# Patient Record
Sex: Female | Born: 1940 | Race: White | Hispanic: No | Smoking: Never smoker
Health system: Southern US, Community
[De-identification: ages and names within clinical notes are randomized; demographics above are authoritative.]

## PROBLEM LIST (undated history)

## (undated) DIAGNOSIS — M1990 Unspecified osteoarthritis, unspecified site: Secondary | ICD-10-CM

## (undated) DIAGNOSIS — K5792 Diverticulitis of intestine, part unspecified, without perforation or abscess without bleeding: Secondary | ICD-10-CM

## (undated) DIAGNOSIS — J849 Interstitial pulmonary disease, unspecified: Secondary | ICD-10-CM

## (undated) DIAGNOSIS — J309 Allergic rhinitis, unspecified: Secondary | ICD-10-CM

## (undated) DIAGNOSIS — J45909 Unspecified asthma, uncomplicated: Secondary | ICD-10-CM

## (undated) DIAGNOSIS — I10 Essential (primary) hypertension: Secondary | ICD-10-CM

## (undated) DIAGNOSIS — E559 Vitamin D deficiency, unspecified: Secondary | ICD-10-CM

## (undated) DIAGNOSIS — H8109 Meniere's disease, unspecified ear: Secondary | ICD-10-CM

## (undated) DIAGNOSIS — I700 Atherosclerosis of aorta: Secondary | ICD-10-CM

## (undated) DIAGNOSIS — G609 Hereditary and idiopathic neuropathy, unspecified: Secondary | ICD-10-CM

## (undated) DIAGNOSIS — J302 Other seasonal allergic rhinitis: Secondary | ICD-10-CM

## (undated) DIAGNOSIS — G4700 Insomnia, unspecified: Secondary | ICD-10-CM

## (undated) DIAGNOSIS — E7800 Pure hypercholesterolemia, unspecified: Secondary | ICD-10-CM

## (undated) DIAGNOSIS — R51 Headache: Secondary | ICD-10-CM

## (undated) DIAGNOSIS — L309 Dermatitis, unspecified: Secondary | ICD-10-CM

## (undated) DIAGNOSIS — E039 Hypothyroidism, unspecified: Secondary | ICD-10-CM

## (undated) DIAGNOSIS — M8580 Other specified disorders of bone density and structure, unspecified site: Secondary | ICD-10-CM

## (undated) DIAGNOSIS — F419 Anxiety disorder, unspecified: Secondary | ICD-10-CM

## (undated) DIAGNOSIS — K219 Gastro-esophageal reflux disease without esophagitis: Secondary | ICD-10-CM

## (undated) HISTORY — DX: Diverticulitis of intestine, part unspecified, without perforation or abscess without bleeding: K57.92

## (undated) HISTORY — DX: Hypothyroidism, unspecified: E03.9

## (undated) HISTORY — DX: Hereditary and idiopathic neuropathy, unspecified: G60.9

## (undated) HISTORY — DX: Vitamin D deficiency, unspecified: E55.9

## (undated) HISTORY — DX: Anxiety disorder, unspecified: F41.9

## (undated) HISTORY — DX: Unspecified osteoarthritis, unspecified site: M19.90

## (undated) HISTORY — DX: Allergic rhinitis, unspecified: J30.9

## (undated) HISTORY — DX: Other seasonal allergic rhinitis: J30.2

## (undated) HISTORY — DX: Insomnia, unspecified: G47.00

## (undated) HISTORY — DX: Headache: R51

## (undated) HISTORY — DX: Interstitial pulmonary disease, unspecified: J84.9

## (undated) HISTORY — DX: Essential (primary) hypertension: I10

## (undated) HISTORY — DX: Atherosclerosis of aorta: I70.0

## (undated) HISTORY — DX: Meniere's disease, unspecified ear: H81.09

## (undated) HISTORY — DX: Pure hypercholesterolemia, unspecified: E78.00

## (undated) HISTORY — PX: CATARACT EXTRACTION: SUR2

## (undated) HISTORY — PX: CHOLECYSTECTOMY: SHX55

## (undated) HISTORY — DX: Dermatitis, unspecified: L30.9

## (undated) HISTORY — DX: Other specified disorders of bone density and structure, unspecified site: M85.80

---

## 1969-04-10 HISTORY — PX: ABDOMINAL HYSTERECTOMY: SHX81

## 1969-04-10 HISTORY — PX: PARTIAL HYSTERECTOMY: SHX80

## 1969-04-10 HISTORY — PX: APPENDECTOMY: SHX54

## 1987-04-11 HISTORY — PX: GALLBLADDER SURGERY: SHX652

## 1998-04-20 ENCOUNTER — Ambulatory Visit (HOSPITAL_COMMUNITY): Admission: RE | Admit: 1998-04-20 | Discharge: 1998-04-20 | Payer: Self-pay | Admitting: Family Medicine

## 1999-05-24 ENCOUNTER — Ambulatory Visit (HOSPITAL_COMMUNITY): Admission: RE | Admit: 1999-05-24 | Discharge: 1999-05-24 | Payer: Self-pay | Admitting: Family Medicine

## 1999-05-24 ENCOUNTER — Encounter: Payer: Self-pay | Admitting: Family Medicine

## 2005-03-20 ENCOUNTER — Emergency Department (HOSPITAL_COMMUNITY): Admission: EM | Admit: 2005-03-20 | Discharge: 2005-03-21 | Payer: Self-pay | Admitting: Emergency Medicine

## 2009-01-14 ENCOUNTER — Encounter: Admission: RE | Admit: 2009-01-14 | Discharge: 2009-01-14 | Payer: Self-pay | Admitting: Family Medicine

## 2010-07-18 ENCOUNTER — Encounter: Payer: Self-pay | Admitting: Gastroenterology

## 2011-02-17 ENCOUNTER — Institutional Professional Consult (permissible substitution): Payer: Self-pay | Admitting: Internal Medicine

## 2011-02-20 ENCOUNTER — Encounter: Payer: Self-pay | Admitting: Internal Medicine

## 2011-02-20 ENCOUNTER — Ambulatory Visit (INDEPENDENT_AMBULATORY_CARE_PROVIDER_SITE_OTHER)
Admission: RE | Admit: 2011-02-20 | Discharge: 2011-02-20 | Disposition: A | Discharge status: Home / Self Care | Payer: Medicare Other | Source: Ambulatory Visit | Attending: Internal Medicine | Admitting: Internal Medicine

## 2011-02-20 ENCOUNTER — Ambulatory Visit (INDEPENDENT_AMBULATORY_CARE_PROVIDER_SITE_OTHER): Payer: Medicare Other | Admitting: Internal Medicine

## 2011-02-20 VITALS — BP 164/84 | HR 80 | Ht 65.0 in | Wt 196.6 lb

## 2011-02-20 DIAGNOSIS — R05 Cough: Secondary | ICD-10-CM

## 2011-02-20 DIAGNOSIS — R059 Cough, unspecified: Secondary | ICD-10-CM

## 2011-02-20 MED ORDER — TRAMADOL HCL 50 MG PO TABS: 50.0000 mg | ORAL_TABLET | Freq: Three times a day (TID) | ORAL | Status: DC | PRN

## 2011-02-20 NOTE — Patient Instructions (Signed)
Script sent for tramadol tabs for  Cough  Order CXR- dx cough  Use your Qvar inhaler   Every day, 2 puffs,then rinse mouth, twice daily  Use your red Proair rescue inhaler up to 2 puffs, 4 times daily as needed  Take extra care against reflux- don't lie down for 2 hours after eating or drinking. Pay attention as you swallow- see how important eating and drinking are to triggering this cough
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
## 2011-02-23 DIAGNOSIS — J42 Unspecified chronic bronchitis: Secondary | ICD-10-CM | POA: Insufficient documentation

## 2011-03-23 ENCOUNTER — Encounter: Payer: Self-pay | Admitting: Internal Medicine

## 2011-03-23 ENCOUNTER — Ambulatory Visit (INDEPENDENT_AMBULATORY_CARE_PROVIDER_SITE_OTHER): Payer: Medicare Other | Admitting: Internal Medicine

## 2011-03-23 VITALS — BP 118/78 | HR 85 | Ht 65.0 in | Wt 198.6 lb

## 2011-03-23 DIAGNOSIS — R05 Cough: Secondary | ICD-10-CM

## 2011-03-23 DIAGNOSIS — Z23 Encounter for immunization: Secondary | ICD-10-CM

## 2011-03-23 MED ORDER — BENZONATATE 200 MG PO CAPS: 200.0000 mg | ORAL_CAPSULE | Freq: Three times a day (TID) | ORAL | Status: DC | PRN
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
## 2011-03-23 NOTE — Patient Instructions (Signed)
Stop tramadol. Try replacing it with benzonatate pearls for cough as needed- script sent  Continue Qvar for another month. You can decide then to stop and see how you do, and restart if needed  Flu vaccine  Please call as needed

## 2011-03-26 NOTE — Assessment & Plan Note (Signed)
Cough centers suppression seems to have worked well for her and may have broken a feedback loop. We will stop tramadol now and try benzonatate on an as-needed basis while she continues Qvar 40. Flu vaccine given.

## 2011-06-16 ENCOUNTER — Other Ambulatory Visit: Payer: Self-pay | Admitting: Family Medicine

## 2011-06-16 ENCOUNTER — Ambulatory Visit
Admission: RE | Admit: 2011-06-16 | Discharge: 2011-06-16 | Disposition: A | Discharge status: Home / Self Care | Payer: Medicare Other | Source: Ambulatory Visit | Attending: Family Medicine | Admitting: Family Medicine

## 2011-06-16 DIAGNOSIS — R1032 Left lower quadrant pain: Secondary | ICD-10-CM

## 2011-06-16 MED ORDER — IOHEXOL 300 MG/ML  SOLN
30.0000 mL | INTRAMUSCULAR | Status: AC
  Administered 2011-06-16: 30 mL via ORAL

## 2011-06-16 MED ORDER — IOHEXOL 300 MG/ML  SOLN
125.0000 mL | Freq: Once | INTRAMUSCULAR | Status: AC | PRN
  Administered 2011-06-16: 125 mL via INTRAVENOUS

## 2011-07-21 ENCOUNTER — Other Ambulatory Visit: Payer: Self-pay | Admitting: Family Medicine

## 2011-07-21 ENCOUNTER — Ambulatory Visit
Admission: RE | Admit: 2011-07-21 | Discharge: 2011-07-21 | Disposition: A | Discharge status: Home / Self Care | Payer: Medicare Other | Source: Ambulatory Visit | Attending: Family Medicine | Admitting: Family Medicine

## 2011-07-21 DIAGNOSIS — R51 Headache: Secondary | ICD-10-CM

## 2011-07-21 DIAGNOSIS — M542 Cervicalgia: Secondary | ICD-10-CM

## 2011-08-28 ENCOUNTER — Other Ambulatory Visit: Payer: Self-pay | Admitting: Family Medicine

## 2011-08-28 ENCOUNTER — Ambulatory Visit
Admission: RE | Admit: 2011-08-28 | Discharge: 2011-08-28 | Disposition: A | Discharge status: Home / Self Care | Payer: Medicare Other | Source: Ambulatory Visit | Attending: Family Medicine | Admitting: Family Medicine

## 2011-08-28 DIAGNOSIS — J984 Other disorders of lung: Secondary | ICD-10-CM

## 2011-09-05 ENCOUNTER — Telehealth: Payer: Self-pay | Admitting: Gastroenterology

## 2011-09-05 NOTE — Telephone Encounter (Signed)
Pt states that she has been having problems with diverticulitis. States she took Cipro and Flagyl for 10day. When she stopped them she started having problems again. States she took 14 more days of the antibiotics followed by 14 days of a probiotic. She reports she is still having some problems and her PCP recommended she call here. Pt scheduled to see Dr. Arlyce Dice 09/08/11@9 :15am. Pt aware of appt date and time.

## 2011-09-08 ENCOUNTER — Ambulatory Visit (INDEPENDENT_AMBULATORY_CARE_PROVIDER_SITE_OTHER): Payer: Medicare Other | Admitting: Gastroenterology

## 2011-09-08 ENCOUNTER — Other Ambulatory Visit (INDEPENDENT_AMBULATORY_CARE_PROVIDER_SITE_OTHER): Payer: Medicare Other

## 2011-09-08 ENCOUNTER — Encounter: Payer: Self-pay | Admitting: Gastroenterology

## 2011-09-08 VITALS — BP 124/64 | HR 76 | Ht 65.0 in | Wt 198.0 lb

## 2011-09-08 DIAGNOSIS — R05 Cough: Secondary | ICD-10-CM

## 2011-09-08 DIAGNOSIS — K5732 Diverticulitis of large intestine without perforation or abscess without bleeding: Secondary | ICD-10-CM

## 2011-09-08 DIAGNOSIS — R1032 Left lower quadrant pain: Secondary | ICD-10-CM

## 2011-09-08 DIAGNOSIS — K5792 Diverticulitis of intestine, part unspecified, without perforation or abscess without bleeding: Secondary | ICD-10-CM | POA: Insufficient documentation

## 2011-09-08 LAB — BASIC METABOLIC PANEL
Chloride: 101 mEq/L (ref 96–112)
GFR: 75.05 mL/min (ref >60.00)
Potassium: 3.9 mEq/L (ref 3.5–5.1)
Sodium: 138 mEq/L (ref 135–145)

## 2011-09-08 NOTE — Patient Instructions (Signed)
  You have been scheduled for a CT scan of the abdomen and pelvis at [facility] ([address]---this is in the same building as [facility]).   You are scheduled on 09/12/2011 at 9am. You should arrive 15 minutes prior to your appointment time for registration. Please follow the written instructions below on the day of your exam:  WARNING: IF YOU ARE ALLERGIC TO IODINE/X-RAY DYE, PLEASE NOTIFY RADIOLOGY IMMEDIATELY AT [phone]! YOU WILL BE GIVEN A 13 HOUR PREMEDICATION PREP.  1) Do not eat or drink anything after 5am (4 hours prior to your test) 2) You have been given 2 bottles of oral contrast to drink. The solution may taste               better if refrigerated, but do NOT add ice or any other liquid to this solution. Shake             well before drinking.    Drink 1 bottle of contrast @ 7am (2 hours prior to your exam)  Drink 1 bottle of contrast @ 8am (1 hour prior to your exam)  You may take any medications as prescribed with a small amount of water except for the following: Metformin, Glucophage, Glucovance, Avandamet, Riomet, Fortamet, Actoplus Met, Janumet, Glumetza or Metaglip. The above medications must be held the day of the exam AND 48 hours after the exam.  The purpose of you drinking the oral contrast is to aid in the visualization of your intestinal tract. The contrast solution may cause some diarrhea. Before your exam is started, you will be given a small amount of fluid to drink. Depending on your individual set of symptoms, you may also receive an intravenous injection of x-ray contrast/dye. Plan on being at [facility] for 30 minutes or long, depending on the type of exam you are having performed.  If you have any questions regarding your exam or if you need to reschedule, you may call the CT department at [phone] between the hours of 8:00 am and 5:00 pm,  Monday-Friday.  ________________________________________________________________________

## 2011-09-08 NOTE — Assessment & Plan Note (Addendum)
Abdominal pain could be due to residual inflammation related to prior diverticulitis. A low-grade infection is also a possibility. Abdominal abscess is much less likely. Patient has also noted a change in bowel habits which probably is related to antibiotics. Pseudomembranous colitis should be ruled out.  Recommendations #1 followup CT of the abdomen and pelvis #2 check stools for C. difficile toxin #3 colonoscopy if CT scan is unrevealing

## 2011-09-08 NOTE — Progress Notes (Signed)
History of Present Illness: Yolanda Hill is a 70-year-old white female referred at the request of Dr. Harris for evaluation of abdominal pain.  In March, 2013 she developed lower abdominal pain. CT scan, which I reviewed, demonstrated some inflammatory changes in the area of the sigmoid colon consistent with acute diverticulitis. She was treated with antibiotics with only partial  resolution of her pain. This was  followed by a second course of antibiotics approximately 3-4 weeks ago. She still complains of mild, residual lower abdominal pain. She has urgency at the stool but denies diarrhea, per se. There is no history of bleeding or fevers.  Patient also complains of persistent nonproductive cough. She was evaluated by pulmonary and told to possibly have reflux as a cause for her cough. Despite twice a day omeprazole cough continues. Chest x-ray apparently was negative. She denies pyrosis, sore throat or dysphagia.    Past Medical History  Diagnosis Date  . High blood pressure   . High cholesterol   . Seasonal allergies   . Diverticulitis   . Insomnia    Past Surgical History  Procedure Date  . Gallbladder surgery 1989  . Partial hysterectomy 1971   family history includes Emphysema in her paternal grandfather and Heart disease in an unspecified family member. Current Outpatient Prescriptions  Medication Sig Dispense Refill  . albuterol (PROAIR HFA) 108 (90 BASE) MCG/ACT inhaler Inhale 2 puffs into the lungs 4 (four) times daily as needed.        . aspirin 325 MG tablet Take 325 mg by mouth daily.        . azelastine (ASTELIN) 137 MCG/SPRAY nasal spray Place 1 spray into the nose 2 (two) times daily. Use in each nostril as directed       . beclomethasone (QVAR) 40 MCG/ACT inhaler Inhale 2 puffs into the lungs 2 (two) times daily.        . benzonatate (TESSALON) 200 MG capsule Take 1 capsule (200 mg total) by mouth 3 (three) times daily as needed for cough.  30 capsule  4  . citalopram  (CELEXA) 40 MG tablet Take 1 tablet by mouth daily.      . LORazepam (ATIVAN) 0.5 MG tablet Take 0.5 mg by mouth 2 (two) times daily.        . losartan-hydrochlorothiazide (HYZAAR) 50-12.5 MG per tablet Take 1 tablet by mouth daily.      . omeprazole (PRILOSEC) 20 MG capsule Take 1 tablet by mouth Twice daily.      . traZODone (DESYREL) 100 MG tablet Take 100 mg by mouth every other day.      . zolpidem (AMBIEN) 10 MG tablet Take 1 tablet by mouth At bedtime as needed.       Allergies as of 09/08/2011 - Review Complete 09/08/2011  Allergen Reaction Noted  . Codeine Nausea Only 02/20/2011    reports that she has never smoked. She does not have any smokeless tobacco history on file. She reports that she does not drink alcohol or use illicit drugs.     Review of Systems: She complains of posterior neck discomfort Pertinent positive and negative review of systems were noted in the above HPI section. All other review of systems were otherwise negative.  Vital signs were reviewed in today's medical record Physical Exam: General: Well developed , well nourished, no acute distress Head: Normocephalic and atraumatic Eyes:  sclerae anicteric, EOMI Ears: Normal auditory acuity Mouth: No deformity or lesions Neck: Supple, no masses or thyromegaly Lungs:   Clear throughout to auscultation Heart: Regular rate and rhythm; no murmurs, rubs or bruits Abdomen: Soft,  and non distended. No masses, hepatosplenomegaly or hernias noted. Normal Bowel sounds. There is very mild tenderness in the left lower quadrant just inferior to the umbilicus without guarding or rebound Rectal:deferred Musculoskeletal: Symmetrical with no gross deformities  Skin: No lesions on visible extremities Pulses:  Normal pulses noted Extremities: No clubbing, cyanosis, edema or deformities noted Neurological: Alert oriented x 4, grossly nonfocal Cervical Nodes:  No significant cervical adenopathy Inguinal Nodes: No significant  inguinal adenopathy Psychological:  Alert and cooperative. Normal mood and affect       

## 2011-09-08 NOTE — Assessment & Plan Note (Signed)
Symptoms have not improved with occasions, thus far including omeprazole. The question of cough due to reflux has been raised.  Recommendations #1 upper endoscopy with 48 hour bravo pH study while on omeprazole to rule out significant acid reflux

## 2011-09-11 ENCOUNTER — Other Ambulatory Visit: Payer: Medicare Other

## 2011-09-11 DIAGNOSIS — K5792 Diverticulitis of intestine, part unspecified, without perforation or abscess without bleeding: Secondary | ICD-10-CM

## 2011-09-11 DIAGNOSIS — R1032 Left lower quadrant pain: Secondary | ICD-10-CM

## 2011-09-12 ENCOUNTER — Ambulatory Visit (INDEPENDENT_AMBULATORY_CARE_PROVIDER_SITE_OTHER)
Admission: RE | Admit: 2011-09-12 | Discharge: 2011-09-12 | Disposition: A | Discharge status: Home / Self Care | Payer: Medicare Other | Source: Ambulatory Visit | Attending: Gastroenterology | Admitting: Gastroenterology

## 2011-09-12 DIAGNOSIS — K5792 Diverticulitis of intestine, part unspecified, without perforation or abscess without bleeding: Secondary | ICD-10-CM

## 2011-09-12 DIAGNOSIS — K5732 Diverticulitis of large intestine without perforation or abscess without bleeding: Secondary | ICD-10-CM

## 2011-09-12 LAB — CLOSTRIDIUM DIFFICILE EIA: CDIFTX: NEGATIVE

## 2011-09-12 MED ORDER — IOHEXOL 300 MG/ML  SOLN
100.0000 mL | Freq: Once | INTRAMUSCULAR | Status: AC | PRN
  Administered 2011-09-12: 100 mL via INTRAVENOUS

## 2011-09-13 ENCOUNTER — Encounter: Payer: Self-pay | Admitting: Gastroenterology

## 2011-09-14 ENCOUNTER — Telehealth: Payer: Self-pay

## 2011-09-14 NOTE — Telephone Encounter (Signed)
Spoke with pt and let her know her CT results. Pt wants to know which procedure she should have 1st. She is already scheduled for EGD with bravo 09/29/11. Dr. Arlyce Dice states she could have colon in 2-3 weeks. Pt wants to know which is more important and should be done 1st. Please advise.

## 2011-09-18 NOTE — Telephone Encounter (Signed)
EGD first

## 2011-09-18 NOTE — Telephone Encounter (Signed)
Pt aware.

## 2011-09-18 NOTE — Telephone Encounter (Signed)
Left message for pt to call back  °

## 2011-09-29 ENCOUNTER — Encounter (HOSPITAL_COMMUNITY)
Admission: RE | Disposition: A | Discharge status: Home / Self Care | Payer: Self-pay | Source: Ambulatory Visit | Attending: Gastroenterology

## 2011-09-29 ENCOUNTER — Encounter (HOSPITAL_BASED_OUTPATIENT_CLINIC_OR_DEPARTMENT_OTHER): Payer: Medicare Other | Admitting: Gastroenterology

## 2011-09-29 ENCOUNTER — Encounter (HOSPITAL_COMMUNITY): Payer: Self-pay | Admitting: *Deleted

## 2011-09-29 ENCOUNTER — Ambulatory Visit (HOSPITAL_COMMUNITY)
Admission: RE | Admit: 2011-09-29 | Discharge: 2011-09-29 | Disposition: A | Discharge status: Home / Self Care | Payer: Medicare Other | Source: Ambulatory Visit | Attending: Gastroenterology | Admitting: Gastroenterology

## 2011-09-29 DIAGNOSIS — R059 Cough, unspecified: Secondary | ICD-10-CM | POA: Insufficient documentation

## 2011-09-29 DIAGNOSIS — K219 Gastro-esophageal reflux disease without esophagitis: Secondary | ICD-10-CM | POA: Insufficient documentation

## 2011-09-29 DIAGNOSIS — I10 Essential (primary) hypertension: Secondary | ICD-10-CM | POA: Insufficient documentation

## 2011-09-29 DIAGNOSIS — E7800 Pure hypercholesterolemia, unspecified: Secondary | ICD-10-CM | POA: Insufficient documentation

## 2011-09-29 DIAGNOSIS — R05 Cough: Secondary | ICD-10-CM

## 2011-09-29 DIAGNOSIS — Z79899 Other long term (current) drug therapy: Secondary | ICD-10-CM | POA: Insufficient documentation

## 2011-09-29 DIAGNOSIS — K449 Diaphragmatic hernia without obstruction or gangrene: Secondary | ICD-10-CM | POA: Insufficient documentation

## 2011-09-29 HISTORY — DX: Unspecified asthma, uncomplicated: J45.909

## 2011-09-29 HISTORY — PX: BRAVO PH STUDY: SHX5421

## 2011-09-29 HISTORY — DX: Unspecified osteoarthritis, unspecified site: M19.90

## 2011-09-29 HISTORY — DX: Headache: R51

## 2011-09-29 HISTORY — PX: ESOPHAGOGASTRODUODENOSCOPY: SHX5428

## 2011-09-29 HISTORY — DX: Gastro-esophageal reflux disease without esophagitis: K21.9

## 2011-09-29 SURGERY — EGD (ESOPHAGOGASTRODUODENOSCOPY)
Anesthesia: Moderate Sedation

## 2011-09-29 MED ORDER — MIDAZOLAM HCL 10 MG/2ML IJ SOLN
INTRAMUSCULAR | Status: DC | PRN
  Administered 2011-09-29 (×3): 2 mg via INTRAVENOUS

## 2011-09-29 MED ORDER — SODIUM CHLORIDE 0.9 % IV SOLN: Freq: Once | INTRAVENOUS | Status: DC

## 2011-09-29 MED ORDER — FENTANYL NICU IV SYRINGE 50 MCG/ML
INJECTION | INTRAMUSCULAR | Status: DC | PRN
  Administered 2011-09-29 (×3): 25 ug via INTRAVENOUS

## 2011-09-29 MED ORDER — FENTANYL CITRATE 0.05 MG/ML IJ SOLN
INTRAMUSCULAR | Status: AC
  Filled 2011-09-29: qty 2

## 2011-09-29 MED ORDER — DIPHENHYDRAMINE HCL 50 MG/ML IJ SOLN
INTRAMUSCULAR | Status: AC
  Filled 2011-09-29: qty 1

## 2011-09-29 MED ORDER — MIDAZOLAM HCL 10 MG/2ML IJ SOLN
INTRAMUSCULAR | Status: AC
  Filled 2011-09-29: qty 2

## 2011-09-29 MED ORDER — BUTAMBEN-TETRACAINE-BENZOCAINE 2-2-14 % EX AERO
INHALATION_SPRAY | CUTANEOUS | Status: DC | PRN
  Administered 2011-09-29: 2 via TOPICAL

## 2011-09-29 MED ORDER — GLYCOPYRROLATE 0.2 MG/ML IJ SOLN
INTRAMUSCULAR | Status: AC
  Filled 2011-09-29: qty 1

## 2011-09-29 MED ORDER — GLYCOPYRROLATE 0.2 MG/ML IJ SOLN
INTRAMUSCULAR | Status: DC | PRN
  Administered 2011-09-29: 0.2 mg via INTRAVENOUS

## 2011-09-29 MED ORDER — DIPHENHYDRAMINE HCL 50 MG/ML IJ SOLN
INTRAMUSCULAR | Status: DC | PRN
  Administered 2011-09-29: 25 mg via INTRAVENOUS

## 2011-09-29 NOTE — Discharge Instructions (Addendum)
Endoscopy Care After Please read the instructions outlined below and refer to this sheet in the next few weeks. These discharge instructions provide you with general information on caring for yourself after you leave the hospital. Your doctor may also give you specific instructions. While your treatment has been planned according to the most current medical practices available, unavoidable complications occasionally occur. If you have any problems or questions after discharge, please call your doctor. HOME CARE INSTRUCTIONS Activity  You may resume your regular activity but move at a slower pace for the next 24 hours.   Take frequent rest periods for the next 24 hours.   Walking will help expel (get rid of) the air and reduce the bloated feeling in your abdomen.   No driving for 24 hours (because of the anesthesia (medicine) used during the test).   You may shower.   Do not sign any important legal documents or operate any machinery for 24 hours (because of the anesthesia used during the test).  Nutrition  Drink plenty of fluids.   You may resume your normal diet.   Begin with a light meal and progress to your normal diet.   Avoid alcoholic beverages for 24 hours or as instructed by your caregiver.  Medications You may resume your normal medications unless your caregiver tells you otherwise. What you can expect today  You may experience abdominal discomfort such as a feeling of fullness or "gas" pains.   You may experience a sore throat for 2 to 3 days. This is normal. Gargling with salt water may help this.  Follow-up Your doctor will discuss the results of your test with you. SEEK IMMEDIATE MEDICAL CARE IF:  You have excessive nausea (feeling sick to your stomach) and/or vomiting.   You have severe abdominal pain and distention (swelling).   You have trouble swallowing.   You have a temperature over 100 F (37.8 C).   You have rectal bleeding or vomiting of blood.    Document Released: 11/09/2003 Document Revised: 03/16/2011 Document Reviewed: 05/22/2007 Exit[employer] Patient Information 2012 [employer]. 

## 2011-09-29 NOTE — Op Note (Signed)
[facility] [address] [place], [place]  [address]  ENDOSCOPY PROCEDURE REPORT  PATIENT:  Yolanda, Hill  MR#:  [id number] BIRTHDATE:  [birth year], 70 yrs. old  GENDER:  female  ENDOSCOPIST:  Barbette Hair. Arlyce Dice, MD Referred by:  Holley Bouche, M.D.  PROCEDURE DATE:  09/29/2011 PROCEDURE:  Bravo pH probe placement ASA CLASS:  Class II INDICATIONS:  cough  MEDICATIONS:   These medications were titrated to patient response per physician's verbal order, Fentanyl 75 mcg IV, Versed 6 mg IV, glycopyrrolate (Robinal) 0.2 mg IV TOPICAL ANESTHETIC:  Cetacaine Spray  DESCRIPTION OF PROCEDURE:   After the risks and benefits of the procedure were explained, informed consent was obtained.  The Pentax Gastroscope [id number] endoscope was introduced through the mouth and advanced to the third portion of the duodenum.  The instrument was slowly withdrawn as the mucosa was fully examined. <<PROCEDUREIMAGES>>  A hiatal hernia was found. 2 cm sliding hiatal. Z line at 42 cm. A Bravo pH monitor was attached to the esophageal mucosa. bravo pH probe was inserted at 36 cm from the incisors (see image1, image2, and image4).    Retroflexed views revealed no abnormalities. The scope was then withdrawn from the patient and the procedure completed.  COMPLICATIONS:  None  ENDOSCOPIC IMPRESSION: 1) GERD #2 chronic cough RECOMMENDATIONS: 1) continue PPI 2) Call office next 2-3 days to schedule an office appointment for 2 weeks  ______________________________ Barbette Hair. Arlyce Dice, MD  CC:  n. eSIGNED:   Barbette Hair. Kyndle Schlender at 09/29/2011 12:51 PM  Vella Redhead, [phone]

## 2011-09-29 NOTE — H&P (View-Only) (Signed)
History of Present Illness: Yolanda Hill is a 71 year old white female referred at the request of Dr. Tiburcio Pea for evaluation of abdominal pain.  In March, 2013 she developed lower abdominal pain. CT scan, which I reviewed, demonstrated some inflammatory changes in the area of the sigmoid colon consistent with acute diverticulitis. She was treated with antibiotics with only partial  resolution of her pain. This was  followed by a second course of antibiotics approximately 3-4 weeks ago. She still complains of mild, residual lower abdominal pain. She has urgency at the stool but denies diarrhea, per se. There is no history of bleeding or fevers.  Patient also complains of persistent nonproductive cough. She was evaluated by pulmonary and told to possibly have reflux as a cause for her cough. Despite twice a day omeprazole cough continues. Chest x-ray apparently was negative. She denies pyrosis, sore throat or dysphagia.    Past Medical History  Diagnosis Date  . High blood pressure   . High cholesterol   . Seasonal allergies   . Diverticulitis   . Insomnia    Past Surgical History  Procedure Date  . Gallbladder surgery 1989  . Partial hysterectomy 1971   family history includes Emphysema in her paternal grandfather and Heart disease in an unspecified family member. Current Outpatient Prescriptions  Medication Sig Dispense Refill  . albuterol (PROAIR HFA) 108 (90 BASE) MCG/ACT inhaler Inhale 2 puffs into the lungs 4 (four) times daily as needed.        Marland Kitchen aspirin 325 MG tablet Take 325 mg by mouth daily.        Marland Kitchen azelastine (ASTELIN) 137 MCG/SPRAY nasal spray Place 1 spray into the nose 2 (two) times daily. Use in each nostril as directed       . beclomethasone (QVAR) 40 MCG/ACT inhaler Inhale 2 puffs into the lungs 2 (two) times daily.        . benzonatate (TESSALON) 200 MG capsule Take 1 capsule (200 mg total) by mouth 3 (three) times daily as needed for cough.  30 capsule  4  . citalopram  (CELEXA) 40 MG tablet Take 1 tablet by mouth daily.      Marland Kitchen LORazepam (ATIVAN) 0.5 MG tablet Take 0.5 mg by mouth 2 (two) times daily.        Marland Kitchen losartan-hydrochlorothiazide (HYZAAR) 50-12.5 MG per tablet Take 1 tablet by mouth daily.      Marland Kitchen omeprazole (PRILOSEC) 20 MG capsule Take 1 tablet by mouth Twice daily.      . traZODone (DESYREL) 100 MG tablet Take 100 mg by mouth every other day.      . zolpidem (AMBIEN) 10 MG tablet Take 1 tablet by mouth At bedtime as needed.       Allergies as of 09/08/2011 - Review Complete 09/08/2011  Allergen Reaction Noted  . Codeine Nausea Only 02/20/2011    reports that she has never smoked. She does not have any smokeless tobacco history on file. She reports that she does not drink alcohol or use illicit drugs.     Review of Systems: She complains of posterior neck discomfort Pertinent positive and negative review of systems were noted in the above HPI section. All other review of systems were otherwise negative.  Vital signs were reviewed in today's medical record Physical Exam: General: Well developed , well nourished, no acute distress Head: Normocephalic and atraumatic Eyes:  sclerae anicteric, EOMI Ears: Normal auditory acuity Mouth: No deformity or lesions Neck: Supple, no masses or thyromegaly Lungs:  Clear throughout to auscultation Heart: Regular rate and rhythm; no murmurs, rubs or bruits Abdomen: Soft,  and non distended. No masses, hepatosplenomegaly or hernias noted. Normal Bowel sounds. There is very mild tenderness in the left lower quadrant just inferior to the umbilicus without guarding or rebound Rectal:deferred Musculoskeletal: Symmetrical with no gross deformities  Skin: No lesions on visible extremities Pulses:  Normal pulses noted Extremities: No clubbing, cyanosis, edema or deformities noted Neurological: Alert oriented x 4, grossly nonfocal Cervical Nodes:  No significant cervical adenopathy Inguinal Nodes: No significant  inguinal adenopathy Psychological:  Alert and cooperative. Normal mood and affect

## 2011-09-29 NOTE — Interval H&P Note (Signed)
History and Physical Interval Note:  09/29/2011 12:31 PM  Yolanda Hill  has presented today for surgery, with the diagnosis of abdominal pain  The various methods of treatment have been discussed with the patient and family. After consideration of risks, benefits and other options for treatment, the patient has consented to  Procedure(s) (LRB): ESOPHAGOGASTRODUODENOSCOPY (EGD) (N/A) BRAVO PH STUDY (N/A) as a surgical intervention .  The patient's history has been reviewed, patient examined, no change in status, stable for surgery.  I have reviewed the patients' chart and labs.  Questions were answered to the patient's satisfaction.     The recent H&P (dated *09/08/11**) was reviewed, the patient was examined and there is no change in the patients condition since that H&P was completed.   [name]  09/29/2011, 12:31 PM   [name]

## 2011-10-02 ENCOUNTER — Encounter (HOSPITAL_COMMUNITY): Payer: Self-pay | Admitting: Gastroenterology

## 2011-10-03 ENCOUNTER — Telehealth: Payer: Self-pay | Admitting: Gastroenterology

## 2011-10-03 NOTE — Telephone Encounter (Signed)
Pt scheduled for OV 10/23/11@10 :30am. Pt aware of appt date and time.

## 2011-10-23 ENCOUNTER — Ambulatory Visit (INDEPENDENT_AMBULATORY_CARE_PROVIDER_SITE_OTHER): Payer: Medicare Other | Admitting: Gastroenterology

## 2011-10-23 ENCOUNTER — Encounter: Payer: Self-pay | Admitting: Gastroenterology

## 2011-10-23 ENCOUNTER — Telehealth: Payer: Self-pay | Admitting: Internal Medicine

## 2011-10-23 VITALS — BP 122/80 | HR 80 | Ht 65.0 in | Wt 196.2 lb

## 2011-10-23 DIAGNOSIS — R1032 Left lower quadrant pain: Secondary | ICD-10-CM

## 2011-10-23 DIAGNOSIS — R05 Cough: Secondary | ICD-10-CM

## 2011-10-23 DIAGNOSIS — R059 Cough, unspecified: Secondary | ICD-10-CM

## 2011-10-23 NOTE — Progress Notes (Signed)
History of Present Illness:  The patient returns for followup of abdominal pain and cough. The former has entirely subsided. CT scan demonstrated mild residual inflammation but no abscess . Since her last visit her pain slowly declined and is now absent.  Cough continues. 48 hour bravo pH study did not demonstrate significant acid reflux. Coughing episodes were independent of her brief episodes of reflux.    Review of Systems: Pertinent positive and negative review of systems were noted in the above HPI section. All other review of systems were otherwise negative.    Current Medications, Allergies, Past Medical History, Past Surgical History, Family History and Social History were reviewed in [employer] electronic medical record  Vital signs were reviewed in today's medical record. Physical Exam: General: Well developed , well nourished, no acute distress

## 2011-10-23 NOTE — Assessment & Plan Note (Signed)
Clinically resolved and probably represented mild inflammatory changes secondary to diverticulitis

## 2011-10-23 NOTE — Patient Instructions (Addendum)
Follow up as needed You will need to schedule a follow up appointment with Dr Maple Hudson

## 2011-10-23 NOTE — Telephone Encounter (Signed)
LMOMTCB x 1. Pt can see TP or one of the other providers this week for cough. CDY out of the office.

## 2011-10-23 NOTE — Telephone Encounter (Signed)
Spoke with pt and scheduled ov with TP for 10-26-11. I offered one with MW for tomorrow and she states can not come in until Thurs. Nothing further needed.

## 2011-10-23 NOTE — Assessment & Plan Note (Signed)
48 hour pH study clearly demonstrated absence of significant esophageal reflux in the setting of persistent cough.  Recommendations #1 per pulmonary

## 2011-10-23 NOTE — Progress Notes (Signed)
Patient ID: Yolanda Hill, female   DOB: [birth year], 71 y.o.   MRN: [id number] 48 hour bravo pH study was performed.  Patient remained on omeprazole.  Findings #1 in the first 24 hours there were 71 reflux episodes. Fraction time below pH 4 was 10.3%. There was no correlation between coughing episodes and reflux. On day 2 there were no episodes of reflux. There are multiple episodes of coughing.  Impression significant gastroesophageal reflux for a 2 hour. On day one that did not correlate with coughing. No significant reflux was seen on day 2. Coughing episodes were entirely independent of reflux.  Recommendations #1 continue omeprazole #2 pulmonary followup for cough

## 2011-10-26 ENCOUNTER — Encounter: Payer: Self-pay | Admitting: Adult Health

## 2011-10-26 ENCOUNTER — Ambulatory Visit (INDEPENDENT_AMBULATORY_CARE_PROVIDER_SITE_OTHER): Payer: Medicare Other | Admitting: Adult Health

## 2011-10-26 VITALS — BP 118/80 | HR 78 | Temp 97.3°F | Ht 65.0 in | Wt 197.6 lb

## 2011-10-26 DIAGNOSIS — R05 Cough: Secondary | ICD-10-CM

## 2011-10-26 MED ORDER — BENZONATATE 200 MG PO CAPS: 200.0000 mg | ORAL_CAPSULE | Freq: Three times a day (TID) | ORAL | Status: DC | PRN

## 2011-10-26 MED ORDER — PREDNISONE 10 MG PO TABS: ORAL_TABLET | ORAL | Status: DC

## 2011-10-26 MED ORDER — TRAMADOL HCL 50 MG PO TABS: 50.0000 mg | ORAL_TABLET | Freq: Three times a day (TID) | ORAL | Status: DC | PRN

## 2011-10-26 MED ORDER — TRAMADOL HCL 50 MG PO TABS: 50.0000 mg | ORAL_TABLET | ORAL | Status: DC | PRN

## 2011-10-26 NOTE — Patient Instructions (Addendum)
Add  Pepcid 20 mg at bedtime. Delsym 2 teaspoons twice daily  Tessalon 200 mg 3 times daily. Tramadol 50 mg, one every 4 hours as needed. For cough control. Goal is no coughing or throat clearing. Use sugarless candy sips of water, to help avoid coughing or throat clearing. Do not use Mint products  Prednisone taper. Over the next week. Chlor-Trimeton 4 mg 2 tablets at bedtime.  I will call with CT results Follow with Dr. Maple Hudson in 4 weeks and as needed Please contact office for sooner follow up if symptoms do not improve or worsen or seek emergency care  Hold fish oil

## 2011-10-26 NOTE — Assessment & Plan Note (Signed)
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 Delsym 2 teaspoons twice daily  Tessalon 200 mg 3 times daily. Tramadol 50 mg, one every 4 hours as needed. For cough control. Goal is no coughing or throat clearing. Use sugarless candy sips of water, to help avoid coughing or throat clearing. Do not use Mint products  Prednisone taper. Over the next week. Chlor-Trimeton 4 mg 2 tablets at bedtime.  I will call with CT results Follow with Dr. Maple Hudson in 4 weeks and as needed Please contact office for sooner follow up if symptoms do not improve or worsen or seek emergency care  Hold fish oil
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
## 2011-10-30 ENCOUNTER — Encounter: Payer: Self-pay | Admitting: Gastroenterology

## 2011-10-31 ENCOUNTER — Ambulatory Visit (INDEPENDENT_AMBULATORY_CARE_PROVIDER_SITE_OTHER)
Admission: RE | Admit: 2011-10-31 | Discharge: 2011-10-31 | Disposition: A | Discharge status: Home / Self Care | Payer: Medicare Other | Source: Ambulatory Visit | Attending: Adult Health | Admitting: Adult Health

## 2011-10-31 DIAGNOSIS — R05 Cough: Secondary | ICD-10-CM

## 2011-10-31 DIAGNOSIS — R059 Cough, unspecified: Secondary | ICD-10-CM

## 2011-11-02 ENCOUNTER — Telehealth: Payer: Self-pay | Admitting: Internal Medicine

## 2011-11-02 NOTE — Telephone Encounter (Signed)
Spoke with patient in regards to message left for CDY. Per CY pt informed of CT results; TP out of office. CT Sinus Neg (-) for Sinusitis. Pt stated she will continue her current regimen per TP at last OV and will call if symptoms persist.   Yolanda Hill, CMA

## 2011-11-02 NOTE — Telephone Encounter (Signed)
Pt saw TP on 10-26-11 and a CT sinus was ordered. TP is out of office andpt is requesting results so I will send request to Dr. Maple Hudson. Please advise on CT sinus results. Thanks. Carron Curie, CMA

## 2011-11-06 ENCOUNTER — Encounter: Payer: Self-pay | Admitting: Adult Health

## 2011-12-13 ENCOUNTER — Other Ambulatory Visit: Payer: Self-pay | Admitting: Adult Health

## 2012-01-09 ENCOUNTER — Other Ambulatory Visit: Payer: Self-pay | Admitting: Adult Health

## 2012-01-09 ENCOUNTER — Ambulatory Visit: Payer: Medicare Other | Admitting: Gastroenterology

## 2012-01-10 NOTE — Telephone Encounter (Signed)
Received electronic refill request for tessalon 200mg  tid prn #45 and tramadol 50mg  q4h prn #45.  Pt last seen 7.18.13 by TP for sick visit and given tessalon and tramadol for cough.  Pt recommended to follow up in 4 weeks w/ CY but no appt was ever made and pt has none pending.    Dr Maple Hudson, may pt have refills w/ the understanding that she must come in for appt?  Thanks.

## 2012-01-10 NOTE — Telephone Encounter (Signed)
Ok to refill, but make a routine f/u appointment.

## 2012-01-11 NOTE — Telephone Encounter (Signed)
LMOMTCB x 1 

## 2012-01-11 NOTE — Telephone Encounter (Signed)
ATC pt x2 - line busy. 

## 2012-01-12 NOTE — Telephone Encounter (Signed)
ATC x2, line busy 

## 2012-01-12 NOTE — Telephone Encounter (Signed)
Returning call can be reached at [phone].Yolanda Hill

## 2012-01-16 NOTE — Telephone Encounter (Signed)
Ok to refill benzonatate and tramadol

## 2012-01-17 NOTE — Telephone Encounter (Signed)
LMOM TCB x1 - left detailed message informing pt that we will be happy to refill these meds for her but she needs to schedule a follow up appt w/ CY before we can do so.

## 2012-01-19 ENCOUNTER — Telehealth: Payer: Self-pay | Admitting: *Deleted

## 2012-01-19 MED ORDER — TRAMADOL HCL 50 MG PO TABS: 50.0000 mg | ORAL_TABLET | ORAL | Status: DC | PRN

## 2012-01-19 NOTE — Telephone Encounter (Signed)
RX has been sent to the pharmacy.

## 2012-01-19 NOTE — Telephone Encounter (Signed)
Received refill request for tramadol 50 mg 1 po q4hrs prn cough. Last saw TP 10/26/11 and Dr. Maple Hudson 03/23/11. Told to follow up in 1 year. Last refilled 10/16/11 #45 x 1 refill. Please advise if okay to refill. thanks

## 2012-01-19 NOTE — Telephone Encounter (Signed)
Ok to refill tramadol 

## 2012-01-22 NOTE — Telephone Encounter (Signed)
Ok to refill tessalon

## 2012-01-23 MED ORDER — BENZONATATE 200 MG PO CAPS: 200.0000 mg | ORAL_CAPSULE | Freq: Three times a day (TID) | ORAL | Status: DC | PRN

## 2012-01-23 NOTE — Addendum Note (Signed)
Addended by: Boone Master E on: 01/23/2012 05:40 PM   Modules accepted: Orders

## 2012-01-23 NOTE — Telephone Encounter (Signed)
Ok to refill tessalon

## 2012-01-23 NOTE — Telephone Encounter (Signed)
Left detailed message on named voice informing pt that CY okayed for refill on tessalon but she needs to call back as she is overdue for appt.  Refill sent.  Will sign off.

## 2012-02-19 ENCOUNTER — Ambulatory Visit (INDEPENDENT_AMBULATORY_CARE_PROVIDER_SITE_OTHER): Payer: Medicare Other | Admitting: Gastroenterology

## 2012-02-19 ENCOUNTER — Encounter: Payer: Self-pay | Admitting: Gastroenterology

## 2012-02-19 VITALS — BP 150/68 | HR 84 | Ht 65.0 in | Wt 196.4 lb

## 2012-02-19 DIAGNOSIS — Z1211 Encounter for screening for malignant neoplasm of colon: Secondary | ICD-10-CM

## 2012-02-19 DIAGNOSIS — R05 Cough: Secondary | ICD-10-CM

## 2012-02-19 DIAGNOSIS — K5792 Diverticulitis of intestine, part unspecified, without perforation or abscess without bleeding: Secondary | ICD-10-CM

## 2012-02-19 DIAGNOSIS — R053 Chronic cough: Secondary | ICD-10-CM

## 2012-02-19 DIAGNOSIS — K5732 Diverticulitis of large intestine without perforation or abscess without bleeding: Secondary | ICD-10-CM

## 2012-02-19 MED ORDER — NA SULFATE-K SULFATE-MG SULF 17.5-3.13-1.6 GM/177ML PO SOLN: 1.0000 | Freq: Once | ORAL | Status: DC

## 2012-02-19 NOTE — Progress Notes (Signed)
History of Present Illness:  Patient has returned for followup of diverticulitis and for cough. Since her last visit she had one episode of lower abdominal pain that was attributed to recurrent diverticulitis and treated with antibiotics successfully. Cough is better. 48 hour bravo pH study was negative for significant acid reflux. She has no other GI complaints. Last colonoscopy was in 2005. The patient and her PCP requested a followup colonoscopy sooner than 2015.    Review of Systems: Pertinent positive and negative review of systems were noted in the above HPI section. All other review of systems were otherwise negative.    Current Medications, Allergies, Past Medical History, Past Surgical History, Family History and Social History were reviewed in [employer] electronic medical record  Vital signs were reviewed in today's medical record. Physical Exam: General: Well developed , well nourished, no acute distress

## 2012-02-19 NOTE — Assessment & Plan Note (Signed)
Cough appears to be pulmonary in origin. Followup per Dr. Maple Hudson

## 2012-02-19 NOTE — Assessment & Plan Note (Signed)
Followup colonoscopy in the next few weeks because of recurrent episodes of diverticulitis

## 2012-02-19 NOTE — Assessment & Plan Note (Addendum)
Acute diverticulitis by CT scan in March, 2013. Recurrent symptoms over the summer that were treated with antibiotics, presumably secondary to recurrent diverticulitis.  Recommendations #1 followup colonoscopy for further evaluation

## 2012-02-19 NOTE — Patient Instructions (Addendum)
Colonoscopy A colonoscopy is an exam to evaluate your entire colon. In this exam, your colon is cleansed. A long fiberoptic tube is inserted through your rectum and into your colon. The fiberoptic scope (endoscope) is a long bundle of enclosed and very flexible fibers. These fibers transmit light to the area examined and send images from that area to your caregiver. Discomfort is usually minimal. You may be given a drug to help you sleep (sedative) during or prior to the procedure. This exam helps to detect lumps (tumors), polyps, inflammation, and areas of bleeding. Your caregiver may also take a small piece of tissue (biopsy) that will be examined under a microscope. LET YOUR CAREGIVER KNOW ABOUT:   Allergies to food or medicine.  Medicines taken, including vitamins, herbs, eyedrops, over-the-counter medicines, and creams.  Use of steroids (by mouth or creams).  Previous problems with anesthetics or numbing medicines.  History of bleeding problems or blood clots.  Previous surgery.  Other health problems, including diabetes and kidney problems.  Possibility of pregnancy, if this applies. BEFORE THE PROCEDURE   A clear liquid diet may be required for 2 days before the exam.  Ask your caregiver about changing or stopping your regular medications.  Liquid injections (enemas) or laxatives may be required.  A large amount of electrolyte solution may be given to you to drink over a short period of time. This solution is used to clean out your colon.  You should be present 60 minutes prior to your procedure or as directed by your caregiver. AFTER THE PROCEDURE   If you received a sedative or pain relieving medication, you will need to arrange for someone to drive you home.  Occasionally, there is a little blood passed with the first bowel movement. Do not be concerned. FINDING OUT THE RESULTS OF YOUR TEST Not all test results are available during your visit. If your test results are  not back during the visit, make an appointment with your caregiver to find out the results. Do not assume everything is normal if you have not heard from your caregiver or the medical facility. It is important for you to follow up on all of your test results. HOME CARE INSTRUCTIONS   It is not unusual to pass moderate amounts of gas and experience mild abdominal cramping following the procedure. This is due to air being used to inflate your colon during the exam. Walking or a warm pack on your belly (abdomen) may help.  You may resume all normal meals and activities after sedatives and medicines have worn off.  Only take over-the-counter or prescription medicines for pain, discomfort, or fever as directed by your caregiver. Do not use aspirin or blood thinners if a biopsy was taken. Consult your caregiver for medicine usage if biopsies were taken. SEEK IMMEDIATE MEDICAL CARE IF:   You have a fever.  You pass large blood clots or fill a toilet with blood following the procedure. This may also occur 10 to 14 days following the procedure. This is more likely if a biopsy was taken.  You develop abdominal pain that keeps getting worse and cannot be relieved with medicine. Document Released: 03/24/2000 Document Revised: 06/19/2011 Document Reviewed: 11/07/2007 Exit[employer] Patient Information 2013 [employer].  

## 2012-03-11 ENCOUNTER — Ambulatory Visit (AMBULATORY_SURGERY_CENTER): Payer: Medicare Other | Admitting: Gastroenterology

## 2012-03-11 ENCOUNTER — Encounter: Payer: Self-pay | Admitting: Gastroenterology

## 2012-03-11 VITALS — BP 146/77 | HR 78 | Temp 99.5°F | Resp 27 | Ht 65.0 in | Wt 196.0 lb

## 2012-03-11 DIAGNOSIS — D126 Benign neoplasm of colon, unspecified: Secondary | ICD-10-CM

## 2012-03-11 DIAGNOSIS — R1032 Left lower quadrant pain: Secondary | ICD-10-CM

## 2012-03-11 DIAGNOSIS — K5732 Diverticulitis of large intestine without perforation or abscess without bleeding: Secondary | ICD-10-CM

## 2012-03-11 MED ORDER — SODIUM CHLORIDE 0.9 % IV SOLN: 500.0000 mL | INTRAVENOUS | Status: DC

## 2012-03-11 NOTE — Progress Notes (Signed)
Pt vomits during insertion, suctioned well of green liquid.a/w patent at all times sats maintained

## 2012-03-11 NOTE — Op Note (Signed)
[facility] [address] [place], [address]   COLONOSCOPY PROCEDURE REPORT  PATIENT: Yolanda Hill, Yolanda Hill  MR#: [id number] BIRTHDATE: 1940/11/04 , 70  yrs. old GENDER: Female ENDOSCOPIST: Louis Meckel, MD REFERRED BY: PROCEDURE DATE:  03/11/2012 PROCEDURE:   Colonoscopy with snare polypectomy ASA CLASS:   Class II INDICATIONS:average risk screening. MEDICATIONS: MAC sedation, administered by CRNA, propofol (Diprivan) 400mg  IV, and Glucagon 1 mg IV  DESCRIPTION OF PROCEDURE:   After the risks benefits and alternatives of the procedure were thoroughly explained, informed consent was obtained.  A digital rectal exam revealed no abnormalities of the rectum.   The LB [id number]  endoscope was introduced through the anus and advanced to the cecum, which was identified by both the appendix and ileocecal valve. No adverse events experienced.   The quality of the prep was Suprep excellent The patient had marked luminal spasm rendering passage of the colonoscope through the sigmoid  and descending colon  very difficult.The instrument was then slowly withdrawn as the colon was fully examined.      COLON FINDINGS: A sessile polyp measuring 4 mm in size was found in the rectum.  A polypectomy was performed with a cold snare.  The resection was complete and the polyp tissue was completely retrieved.   Moderate diverticulosis was noted in the sigmoid colon.   Mild diverticulosis was noted in the transverse colon. The colon mucosa was otherwise normal.  Retroflexed views revealed no abnormalities. The time to cecum=20 minutes 56 seconds. Withdrawal time=6 minutes 53 seconds.  The scope was withdrawn and the procedure completed. COMPLICATIONS: There were no complications.  ENDOSCOPIC IMPRESSION: 1.   Sessile polyp measuring 4 mm in size was found in the rectum; polypectomy was performed with a cold snare 2.   Moderate diverticulosis was noted in the sigmoid  colon 3.   Mild diverticulosis was noted in the transverse colon 4.   The colon mucosa was otherwise normal  RECOMMENDATIONS: If the polyp(s) removed today are proven to be adenomatous (pre-cancerous) polyps, you will need a repeat colonoscopy in 5 years.  Otherwise you should continue to follow colorectal cancer screening guidelines for "routine risk" patients with colonoscopy in 10 years.  You will receive a letter within 1-2 weeks with the results of your biopsy as well as final recommendations.  Please call my office if you have not received a letter after 3 weeks.   eSigned:  Louis Meckel, MD 03/11/2012 10:13 AM   cc: Johny Blamer MD

## 2012-03-11 NOTE — Patient Instructions (Addendum)

## 2012-03-12 ENCOUNTER — Telehealth: Payer: Self-pay | Admitting: *Deleted

## 2012-03-12 ENCOUNTER — Telehealth: Payer: Self-pay | Admitting: Gastroenterology

## 2012-03-12 NOTE — Telephone Encounter (Signed)
No answer, left message to call if questions or concerns. 

## 2012-03-12 NOTE — Telephone Encounter (Signed)
Pt. Has questions about her report from colonoscopy yesterday.  All questions were addressed and answered with her verbalizing understanding.

## 2012-03-12 NOTE — Telephone Encounter (Signed)
See follow phone call note.

## 2012-03-12 NOTE — Telephone Encounter (Signed)
Pt. Had questions relating to her report of procedure from 12/02.  Questions were addressed and answered.  Pt. Verbalized understanding.

## 2012-03-15 ENCOUNTER — Encounter: Payer: Self-pay | Admitting: Gastroenterology

## 2012-09-09 ENCOUNTER — Other Ambulatory Visit: Payer: Self-pay | Admitting: Family Medicine

## 2012-09-09 DIAGNOSIS — R42 Dizziness and giddiness: Secondary | ICD-10-CM

## 2012-09-10 ENCOUNTER — Ambulatory Visit
Admission: RE | Admit: 2012-09-10 | Discharge: 2012-09-10 | Disposition: A | Discharge status: Home / Self Care | Payer: Medicare Other | Source: Ambulatory Visit | Attending: Family Medicine | Admitting: Family Medicine

## 2012-09-10 DIAGNOSIS — R42 Dizziness and giddiness: Secondary | ICD-10-CM

## 2013-04-07 ENCOUNTER — Ambulatory Visit
Admission: RE | Admit: 2013-04-07 | Discharge: 2013-04-07 | Disposition: A | Discharge status: Home / Self Care | Payer: Medicare Other | Source: Ambulatory Visit | Attending: Family Medicine | Admitting: Family Medicine

## 2013-04-07 ENCOUNTER — Other Ambulatory Visit: Payer: Self-pay | Admitting: Family Medicine

## 2013-04-07 DIAGNOSIS — J209 Acute bronchitis, unspecified: Secondary | ICD-10-CM

## 2013-08-18 ENCOUNTER — Encounter: Payer: Self-pay | Admitting: *Deleted

## 2013-08-18 ENCOUNTER — Encounter (INDEPENDENT_AMBULATORY_CARE_PROVIDER_SITE_OTHER): Payer: Medicare Other

## 2013-08-18 ENCOUNTER — Other Ambulatory Visit: Payer: Self-pay | Admitting: *Deleted

## 2013-08-18 DIAGNOSIS — R002 Palpitations: Secondary | ICD-10-CM

## 2013-08-18 NOTE — Progress Notes (Signed)
Patient ID: Yolanda Hill, female   DOB: 05-07-1940, 72 y.o.   MRN: [id number] E-Cardio verite 30 day cardiac event monitor applied to patient.

## 2013-09-09 ENCOUNTER — Telehealth: Payer: Self-pay | Admitting: Cardiology

## 2013-09-09 NOTE — Telephone Encounter (Signed)
Please let patient know that heart monitor showed NSR with occasional extra heart beats from the top of the heart and forward to Dr. Shirline Frees who ordered the study

## 2013-09-16 NOTE — Telephone Encounter (Signed)
Informed patient

## 2014-03-10 ENCOUNTER — Other Ambulatory Visit: Payer: Self-pay | Admitting: Family Medicine

## 2014-03-10 ENCOUNTER — Ambulatory Visit
Admission: RE | Admit: 2014-03-10 | Discharge: 2014-03-10 | Disposition: A | Discharge status: Home / Self Care | Payer: Medicare Other | Source: Ambulatory Visit | Attending: Family Medicine | Admitting: Family Medicine

## 2014-03-10 DIAGNOSIS — J40 Bronchitis, not specified as acute or chronic: Secondary | ICD-10-CM

## 2014-03-17 ENCOUNTER — Other Ambulatory Visit (INDEPENDENT_AMBULATORY_CARE_PROVIDER_SITE_OTHER): Payer: Medicare Other

## 2014-03-17 ENCOUNTER — Encounter: Payer: Self-pay | Admitting: Internal Medicine

## 2014-03-17 ENCOUNTER — Ambulatory Visit (INDEPENDENT_AMBULATORY_CARE_PROVIDER_SITE_OTHER): Payer: Medicare Other | Admitting: Internal Medicine

## 2014-03-17 VITALS — BP 132/74 | HR 81 | Ht 65.0 in | Wt 165.0 lb

## 2014-03-17 DIAGNOSIS — R05 Cough: Secondary | ICD-10-CM

## 2014-03-17 DIAGNOSIS — R053 Chronic cough: Secondary | ICD-10-CM

## 2014-03-17 LAB — CBC WITH DIFFERENTIAL/PLATELET
BASOS ABS: 0 10*3/uL (ref 0.0–0.1)
Basophils Relative: 0.3 % (ref 0.0–3.0)
Eosinophils Absolute: 0.4 10*3/uL (ref 0.0–0.7)
Eosinophils Relative: 4 % (ref 0.0–5.0)
HEMATOCRIT: 41.3 % (ref 36.0–46.0)
Hemoglobin: 13.5 g/dL (ref 12.0–15.0)
LYMPHS ABS: 2 10*3/uL (ref 0.7–4.0)
Lymphocytes Relative: 18.2 % (ref 12.0–46.0)
MCHC: 32.6 g/dL (ref 30.0–36.0)
MCV: 91.4 fl (ref 78.0–100.0)
MONO ABS: 0.9 10*3/uL (ref 0.1–1.0)
Monocytes Relative: 8.2 % (ref 3.0–12.0)
Neutro Abs: 7.5 10*3/uL (ref 1.4–7.7)
Neutrophils Relative %: 69.3 % (ref 43.0–77.0)
PLATELETS: 407 10*3/uL — AB (ref 150.0–400.0)
RBC: 4.52 Mil/uL (ref 3.87–5.11)
RDW: 12.8 % (ref 11.5–15.5)
WBC: 10.8 10*3/uL — ABNORMAL HIGH (ref 4.0–10.5)

## 2014-03-17 NOTE — Patient Instructions (Addendum)
Sample x 2 Anoro   1 puffs, once daily   Try this instead of Dulera  Order- lab CBC w diff, Allergy profile      Dx Rhinitis, cough  Try otc Mucinex DM  OrderLibrarian, academic

## 2014-03-17 NOTE — Assessment & Plan Note (Signed)
Recent bronchitis consistent with a viral respiratory infection, now resolving. Baseline chronic cough. Acute on chronic bronchitis and rhinitis Plan-sample Anoro inhaler to try instead of Dulera, lab for CBC and allergy profile
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
## 2014-03-18 LAB — ALLERGY FULL PROFILE
Allergen,Goose feathers, e70: <0.1 kU/L
Alternaria Alternata: <0.1 kU/L
Aspergillus fumigatus, m3: <0.1 kU/L
Bermuda Grass: <0.1 kU/L
Box Elder IgE: <0.1 kU/L
Candida Albicans: <0.1 kU/L
Cat Dander: <0.1 kU/L
Curvularia lunata: <0.1 kU/L
D. farinae: <0.1 kU/L
Dog Dander: <0.1 kU/L
Elm IgE: <0.1 kU/L
Fescue: <0.1 kU/L
G009 Red Top: <0.1 kU/L
Goldenrod: <0.1 kU/L
Helminthosporium halodes: <0.1 kU/L
IGE (IMMUNOGLOBULIN E), SERUM: 3 kU/L (ref <115)
Lamb's Quarters: <0.1 kU/L
Plantain: <0.1 kU/L
Sycamore Tree: <0.1 kU/L
Timothy Grass: <0.1 kU/L

## 2014-05-19 ENCOUNTER — Encounter: Payer: Self-pay | Admitting: Internal Medicine

## 2014-05-19 ENCOUNTER — Ambulatory Visit (INDEPENDENT_AMBULATORY_CARE_PROVIDER_SITE_OTHER): Payer: Medicare Other | Admitting: Internal Medicine

## 2014-05-19 VITALS — BP 120/76 | HR 75 | Ht 65.0 in | Wt 162.0 lb

## 2014-05-19 DIAGNOSIS — J418 Mixed simple and mucopurulent chronic bronchitis: Secondary | ICD-10-CM

## 2014-05-19 MED ORDER — UMECLIDINIUM-VILANTEROL 62.5-25 MCG/INH IN AEPB: INHALATION_SPRAY | RESPIRATORY_TRACT | Status: DC

## 2014-05-19 NOTE — Patient Instructions (Signed)
Script sent refilling Anoro inhaler for 1 puff, once daily  Please call as needed

## 2014-05-19 NOTE — Assessment & Plan Note (Signed)
Response to Anoro suggests we can treat this as a bronchitis although there may be other factors contributing. Plan-refill Anoro inhaler
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
## 2014-11-17 ENCOUNTER — Ambulatory Visit (INDEPENDENT_AMBULATORY_CARE_PROVIDER_SITE_OTHER): Payer: Medicare Other | Admitting: Internal Medicine

## 2014-11-17 ENCOUNTER — Encounter: Payer: Self-pay | Admitting: Internal Medicine

## 2014-11-17 VITALS — BP 114/72 | HR 72 | Ht 65.0 in | Wt 162.4 lb

## 2014-11-17 DIAGNOSIS — J410 Simple chronic bronchitis: Secondary | ICD-10-CM

## 2014-11-17 DIAGNOSIS — K219 Gastro-esophageal reflux disease without esophagitis: Secondary | ICD-10-CM | POA: Diagnosis not present

## 2014-11-17 NOTE — Patient Instructions (Signed)
Ok to stay off Singulair  You can talk with Dr Kenton Kingfisher about management of your GERD   We are pleased that your cough is better
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
## 2014-11-22 DIAGNOSIS — K219 Gastro-esophageal reflux disease without esophagitis: Secondary | ICD-10-CM | POA: Insufficient documentation

## 2014-11-22 NOTE — Assessment & Plan Note (Signed)
ENT saw no other basis for cough Plan emphasis on reflux precautions

## 2014-11-22 NOTE — Assessment & Plan Note (Signed)
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 Actual airway inflammation/bronchitis is not clinically evident now

## 2014-12-01 ENCOUNTER — Encounter: Payer: Self-pay | Admitting: Neurology

## 2014-12-01 ENCOUNTER — Ambulatory Visit (INDEPENDENT_AMBULATORY_CARE_PROVIDER_SITE_OTHER): Payer: Medicare Other | Admitting: Neurology

## 2014-12-01 ENCOUNTER — Other Ambulatory Visit: Payer: Self-pay | Admitting: Neurology

## 2014-12-01 VITALS — BP 134/75 | HR 72 | Ht 65.0 in | Wt 164.0 lb

## 2014-12-01 DIAGNOSIS — R51 Headache: Secondary | ICD-10-CM | POA: Diagnosis not present

## 2014-12-01 DIAGNOSIS — R519 Headache, unspecified: Secondary | ICD-10-CM | POA: Insufficient documentation

## 2014-12-01 HISTORY — DX: Headache, unspecified: R51.9

## 2014-12-01 MED ORDER — TIZANIDINE HCL 2 MG PO TABS: ORAL_TABLET | ORAL | Status: DC

## 2014-12-01 NOTE — Progress Notes (Signed)
Reason for visit: Headache  Referring physician: Dr. Charlesetta Garibaldi is a 74 y.o. female  History of present illness:  [name] is a 74 year old right-handed white female with a history of a right occipital headache that has been present for greater than 5 years. Initially, the headache was intermittent, but has been more persistent over the last 2 years. The patient describes a dull achy pain in that area, without generalization throughout the head. She denies any radiation of discomfort behind the right eye. The patient denies any electric shock sensations, with constant achy pain. She denies any increased pain with head turning. She does have some discomfort that goes down into the right side of the neck. She denies any pain down the arms, and she denies any new weakness of the extremities. She is felt to have a peripheral neuropathy with a cold sensation in the feet that has been present for number of years, and she takes gabapentin 300 mg at night for this. The patient denies any significant balance issues or difficulty controlling the bowels or the bladder. She denies any episodes of confusion. She reports a syncopal event that occurred 3 years ago associated with a bout of coughing. She is sent to this office by Dr. Constance Holster. The patient does have a history of Mnire's disease, she reports some dizziness that occurs upon awakening when she first gets out of bed, she is not bothered by this later in the day.  Past Medical History  Diagnosis Date  . High blood pressure   . High cholesterol   . Seasonal allergies   . Diverticulitis   . Insomnia   . Headache(784.0)   . Asthma   . GERD (gastroesophageal reflux disease)   . Arthritis     osteoarthritis, cervical and lumbar  . Meniere disease   . Headache disorder 12/01/2014    Past Surgical History  Procedure Laterality Date  . Gallbladder surgery  1989  . Partial hysterectomy  1971  . Abdominal hysterectomy  1971   partial  . Appendectomy  1971    during hysterectomy  . Cholecystectomy      1989  . Esophagogastroduodenoscopy  09/29/2011    Procedure: ESOPHAGOGASTRODUODENOSCOPY (EGD);  Surgeon: Inda Castle, MD;  Location: [facility];  Service: Endoscopy;  Laterality: N/A;  pt. to stay on ppi  . Bravo ph study  09/29/2011    Procedure: BRAVO [place] STUDY;  Surgeon: Inda Castle, MD;  Location: [facility];  Service: Endoscopy;  Laterality: N/A;    Family History  Problem Relation Age of Onset  . Emphysema Paternal Grandfather   . Heart disease      both sides of family  . Alcohol abuse Father   . Kidney disease Father   . Heart disease Mother   . Diabetes Sister   . Stroke Sister   . Depression Sister     Social history:  reports that she has never smoked. She has never used smokeless tobacco. She reports that she drinks alcohol. She reports that she does not use illicit drugs.  Medications:  Prior to Admission medications   Medication Sig Start Date End Date Taking? Authorizing Provider  acetaminophen (TYLENOL) 500 MG tablet Take 1,000 mg by mouth daily as needed.   Yes Historical Provider, MD  albuterol (ACCUNEB) 0.63 MG/3ML nebulizer solution USE 1 VIAL  VIA NEBULAZER Q 6 H PRN 10/30/14  Yes Historical Provider, MD  azelastine (ASTELIN) 0.1 % nasal spray Place  1 spray into both nostrils 2 (two) times daily. Use in each nostril as directed   Yes Historical Provider, MD  b complex vitamins tablet Take 1 tablet by mouth daily.   Yes Historical Provider, MD  Calcium Carbonate-Vitamin D (CALCIUM-VITAMIN D) 500-200 MG-UNIT per tablet Take 1 tablet by mouth daily.   Yes Historical Provider, MD  cholecalciferol (VITAMIN D) 1000 UNITS tablet Take 1,000 Units by mouth daily.   Yes Historical Provider, MD  Cyanocobalamin (VITAMIN B-12 PO) Take by mouth.   Yes Historical Provider, MD  fluticasone (FLONASE) 50 MCG/ACT nasal spray Place into both nostrils daily.   Yes Historical Provider, MD    gabapentin (NEURONTIN) 300 MG capsule Take 1 capsule by mouth daily. 04/22/14  Yes Historical Provider, MD  LORazepam (ATIVAN) 0.5 MG tablet Take 0.5 mg by mouth as needed.    Yes Historical Provider, MD  losartan-hydrochlorothiazide (HYZAAR) 50-12.5 MG per tablet Take 1 tablet by mouth daily. 12/06/10  Yes Historical Provider, MD  MAGNESIUM PO Take by mouth.   Yes Historical Provider, MD  Multiple Vitamin (MULTIVITAMIN) capsule Take 1 capsule by mouth daily.   Yes Historical Provider, MD  omeprazole (PRILOSEC) 20 MG capsule Take 1 tablet by mouth at bedtime.  02/02/11  Yes Historical Provider, MD  Simethicone (GAS-X PO) Take 1 tablet by mouth at bedtime.   Yes Historical Provider, MD  zolpidem (AMBIEN) 10 MG tablet Take 1 tablet by mouth At bedtime as needed. 02/02/11  Yes Historical Provider, MD  tiZANidine (ZANAFLEX) 2 MG tablet One tablet twice a day for 2 weeks, then take one tablet three times a day 12/01/14   Kathrynn Ducking, MD      Allergies  Allergen Reactions  . Codeine Nausea Only  . Dymista [Azelastine-Fluticasone]     Nasal burning  . Simvastatin     Pt prefers not to take-personal reasonings    ROS:  Out of a complete 14 system review of symptoms, the patient complains only of the following symptoms, and all other reviewed systems are negative.  Headache  Blood pressure 134/75, pulse 72, height 5\' 5"  (1.651 m), weight 164 lb (74.39 kg).  Physical Exam  General: The patient is alert and cooperative at the time of the examination.  Eyes: Pupils are equal, round, and reactive to light. Discs are flat bilaterally.  Neck: The neck is supple, no carotid bruits are noted.  Respiratory: The respiratory examination is clear.  Cardiovascular: The cardiovascular examination reveals a regular rate and rhythm, no obvious murmurs or rubs are noted.  Neuromuscular: The patient lacks about 20 when turning the head to the left, 15 when turning the head to the right.  Skin:  Extremities are without significant edema.  Neurologic Exam  Mental status: The patient is alert and oriented x 3 at the time of the examination. The patient has apparent normal recent and remote memory, with an apparently normal attention span and concentration ability.  Cranial nerves: Facial symmetry is present. There is good sensation of the face to pinprick and soft touch bilaterally. The strength of the facial muscles and the muscles to head turning and shoulder shrug are normal bilaterally. Speech is well enunciated, no aphasia or dysarthria is noted. Extraocular movements are full. Visual fields are full. The tongue is midline, and the patient has symmetric elevation of the soft palate. No obvious hearing deficits are noted.  Motor: The motor testing reveals 5 over 5 strength of all 4 extremities. Good symmetric motor tone is  noted throughout.  Sensory: Sensory testing is intact to pinprick, soft touch, vibration sensation, and position sense on all 4 extremities, with exception of some decreased pinprick sensation on the left foot relative the right, and a stocking pattern sensory change one half way up the left leg, across the ankle on the right. No evidence of extinction is noted.  Coordination: Cerebellar testing reveals good finger-nose-finger and heel-to-shin bilaterally.  Gait and station: Gait is normal. Tandem gait is normal. Romberg is negative. No drift is seen.  Reflexes: Deep tendon reflexes are symmetric and normal bilaterally. The ankle jerk reflexes are well-maintained bilaterally. Toes are downgoing bilaterally.   Assessment/Plan:  1. Right occipital discomfort  2. Vertigo  The patient appears to have some discomfort in the right occipital nerve distribution, but the pain itself appears to be most consistent with chronic muscle tension. The patient does not have neuralgia features to the discomfort. The patient may have some underlying cervical arthritis leading to  the pain. We will start tizanidine working up to a 2 mg 3 times a day dosing. The patient is already on gabapentin at night. If these medications are not effective, the patient may return for a right occipital nerve injection. Otherwise, she will follow-up in 3-4 months. She indicates that she has undergone MRI brain evaluation as part of a memory study, she will bring me the disc of the MRI.  Jill Alexanders MD 12/01/2014 7:44 PM  [facility] [address] [place], [place] [address]  Phone [phone] Fax [phone]

## 2014-12-01 NOTE — Patient Instructions (Addendum)
We will start tizanidine for the headache problem, if this is not helpful, we may consider nerve injections in the future.   Headaches, Frequently Asked Questions MIGRAINE HEADACHES Q: What is migraine? What causes it? How can I treat it? A: Generally, migraine headaches begin as a dull ache. Then they develop into a constant, throbbing, and pulsating pain. You may experience pain at the temples. You may experience pain at the front or back of one or both sides of the head. The pain is usually accompanied by a combination of:  Nausea.  Vomiting.  Sensitivity to light and noise. Some people (about 15%) experience an aura (see below) before an attack. The cause of migraine is believed to be chemical reactions in the brain. Treatment for migraine may include over-the-counter or prescription medications. It may also include self-help techniques. These include relaxation training and biofeedback.  Q: What is an aura? A: About 15% of people with migraine get an "aura". This is a sign of neurological symptoms that occur before a migraine headache. You may see wavy or jagged lines, dots, or flashing lights. You might experience tunnel vision or blind spots in one or both eyes. The aura can include visual or auditory hallucinations (something imagined). It may include disruptions in smell (such as strange odors), taste or touch. Other symptoms include:  Numbness.  A "pins and needles" sensation.  Difficulty in recalling or speaking the correct word. These neurological events may last as long as 60 minutes. These symptoms will fade as the headache begins. Q: What is a trigger? A: Certain physical or environmental factors can lead to or "trigger" a migraine. These include:  Foods.  Hormonal changes.  Weather.  Stress. It is important to remember that triggers are different for everyone. To help prevent migraine attacks, you need to figure out which triggers affect you. Keep a headache  diary. This is a good way to track triggers. The diary will help you talk to your healthcare professional about your condition. Q: Does weather affect migraines? A: Bright sunshine, hot, humid conditions, and drastic changes in barometric pressure may lead to, or "trigger," a migraine attack in some people. But studies have shown that weather does not act as a trigger for everyone with migraines. Q: What is the link between migraine and hormones? A: Hormones start and regulate many of your body's functions. Hormones keep your body in balance within a constantly changing environment. The levels of hormones in your body are unbalanced at times. Examples are during menstruation, pregnancy, or menopause. That can lead to a migraine attack. In fact, about three quarters of all women with migraine report that their attacks are related to the menstrual cycle.  Q: Is there an increased risk of stroke for migraine sufferers? A: The likelihood of a migraine attack causing a stroke is very remote. That is not to say that migraine sufferers cannot have a stroke associated with their migraines. In persons under age 30, the most common associated factor for stroke is migraine headache. But over the course of a person's normal life span, the occurrence of migraine headache may actually be associated with a reduced risk of dying from cerebrovascular disease due to stroke.  Q: What are acute medications for migraine? A: Acute medications are used to treat the pain of the headache after it has started. Examples over-the-counter medications, NSAIDs, ergots, and triptans.  Q: What are the triptans? A: Triptans are the newest class of abortive medications. They are specifically targeted  to treat migraine. Triptans are vasoconstrictors. They moderate some chemical reactions in the brain. The triptans work on receptors in your brain. Triptans help to restore the balance of a neurotransmitter called serotonin. Fluctuations in  levels of serotonin are thought to be a main cause of migraine.  Q: Are over-the-counter medications for migraine effective? A: Over-the-counter, or "OTC," medications may be effective in relieving mild to moderate pain and associated symptoms of migraine. But you should see your caregiver before beginning any treatment regimen for migraine.  Q: What are preventive medications for migraine? A: Preventive medications for migraine are sometimes referred to as "prophylactic" treatments. They are used to reduce the frequency, severity, and length of migraine attacks. Examples of preventive medications include antiepileptic medications, antidepressants, beta-blockers, calcium channel blockers, and NSAIDs (nonsteroidal anti-inflammatory drugs). Q: Why are anticonvulsants used to treat migraine? A: During the past few years, there has been an increased interest in antiepileptic drugs for the prevention of migraine. They are sometimes referred to as "anticonvulsants". Both epilepsy and migraine may be caused by similar reactions in the brain.  Q: Why are antidepressants used to treat migraine? A: Antidepressants are typically used to treat people with depression. They may reduce migraine frequency by regulating chemical levels, such as serotonin, in the brain.  Q: What alternative therapies are used to treat migraine? A: The term "alternative therapies" is often used to describe treatments considered outside the scope of conventional Western medicine. Examples of alternative therapy include acupuncture, acupressure, and yoga. Another common alternative treatment is herbal therapy. Some herbs are believed to relieve headache pain. Always discuss alternative therapies with your caregiver before proceeding. Some herbal products contain arsenic and other toxins. TENSION HEADACHES Q: What is a tension-type headache? What causes it? How can I treat it? A: Tension-type headaches occur randomly. They are often the  result of temporary stress, anxiety, fatigue, or anger. Symptoms include soreness in your temples, a tightening band-like sensation around your head (a "vice-like" ache). Symptoms can also include a pulling feeling, pressure sensations, and contracting head and neck muscles. The headache begins in your forehead, temples, or the back of your head and neck. Treatment for tension-type headache may include over-the-counter or prescription medications. Treatment may also include self-help techniques such as relaxation training and biofeedback. CLUSTER HEADACHES Q: What is a cluster headache? What causes it? How can I treat it? A: Cluster headache gets its name because the attacks come in groups. The pain arrives with little, if any, warning. It is usually on one side of the head. A tearing or bloodshot eye and a runny nose on the same side of the headache may also accompany the pain. Cluster headaches are believed to be caused by chemical reactions in the brain. They have been described as the most severe and intense of any headache type. Treatment for cluster headache includes prescription medication and oxygen. SINUS HEADACHES Q: What is a sinus headache? What causes it? How can I treat it? A: When a cavity in the bones of the face and skull (a sinus) becomes inflamed, the inflammation will cause localized pain. This condition is usually the result of an allergic reaction, a tumor, or an infection. If your headache is caused by a sinus blockage, such as an infection, you will probably have a fever. An x-ray will confirm a sinus blockage. Your caregiver's treatment might include antibiotics for the infection, as well as antihistamines or decongestants.  REBOUND HEADACHES Q: What is a rebound headache? What causes it?  How can I treat it? A: A pattern of taking acute headache medications too often can lead to a condition known as "rebound headache." A pattern of taking too much headache medication includes taking  it more than 2 days per week or in excessive amounts. That means more than the label or a caregiver advises. With rebound headaches, your medications not only stop relieving pain, they actually begin to cause headaches. Doctors treat rebound headache by tapering the medication that is being overused. Sometimes your caregiver will gradually substitute a different type of treatment or medication. Stopping may be a challenge. Regularly overusing a medication increases the potential for serious side effects. Consult a caregiver if you regularly use headache medications more than 2 days per week or more than the label advises. ADDITIONAL QUESTIONS AND ANSWERS Q: What is biofeedback? A: Biofeedback is a self-help treatment. Biofeedback uses special equipment to monitor your body's involuntary physical responses. Biofeedback monitors:  Breathing.  Pulse.  Heart rate.  Temperature.  Muscle tension.  Brain activity. Biofeedback helps you refine and perfect your relaxation exercises. You learn to control the physical responses that are related to stress. Once the technique has been mastered, you do not need the equipment any more. Q: Are headaches hereditary? A: Four out of five (80%) of people that suffer report a family history of migraine. Scientists are not sure if this is genetic or a family predisposition. Despite the uncertainty, a child has a 50% chance of having migraine if one parent suffers. The child has a 75% chance if both parents suffer.  Q: Can children get headaches? A: By the time they reach high school, most young people have experienced some type of headache. Many safe and effective approaches or medications can prevent a headache from occurring or stop it after it has begun.  Q: What type of doctor should I see to diagnose and treat my headache? A: Start with your primary caregiver. Discuss his or her experience and approach to headaches. Discuss methods of classification, diagnosis,  and treatment. Your caregiver may decide to recommend you to a headache specialist, depending upon your symptoms or other physical conditions. Having diabetes, allergies, etc., may require a more comprehensive and inclusive approach to your headache. The National Headache Foundation will provide, upon request, a list of [facility] physician members in your state. Document Released: 06/17/2003 Document Revised: 06/19/2011 Document Reviewed: 11/25/2007 [facility] Patient Information 2015 [place], [place]. This information is not intended to replace advice given to you by your health care provider. Make sure you discuss any questions you have with your health care provider.

## 2014-12-08 ENCOUNTER — Telehealth: Payer: Self-pay | Admitting: Neurology

## 2014-12-08 NOTE — Telephone Encounter (Signed)
Patient wanted Dr. Jannifer Franklin to be aware that she is no longer taking the muscle relaxant. She felt like she was in slow motion and not sleeping well while on it. Her best call back is [phone]

## 2014-12-08 NOTE — Telephone Encounter (Signed)
I called the patient. She stated that she felt like she was having an "out of body experience" and was moving very slow with the tizanidine. She has stopped taking it and began taking extra strength tylenol. She states this helps with her pain enough that it is bearable. She is not interested in having a nerve block. I advised her that if she changes her mind about the nerve block to please call me back. She verbalized understanding. She stated that she dropped a DVD of a MRI off for Dr. Jannifer Franklin to review. She would like to come back and pick it up when he is finished with it.

## 2014-12-08 NOTE — Addendum Note (Signed)
Addended by: Margette Fast on: 12/08/2014 05:54 PM   Modules accepted: Orders, Medications

## 2014-12-08 NOTE — Telephone Encounter (Signed)
I called the patient. The patient could not tolerate the tizanidine, she has stopped the medication. The MRI the brain was done for the memory study is not an adequate diagnostic evaluation. We may consider doing an MRI in the future. She will try Aleve and Tylenol combination for her neuralgia pain. If this is not effective, she will call me.

## 2015-04-13 ENCOUNTER — Other Ambulatory Visit: Payer: Self-pay | Admitting: Family Medicine

## 2015-04-13 ENCOUNTER — Ambulatory Visit
Admission: RE | Admit: 2015-04-13 | Discharge: 2015-04-13 | Disposition: A | Discharge status: Home / Self Care | Payer: Medicare Other | Source: Ambulatory Visit | Attending: Family Medicine | Admitting: Family Medicine

## 2015-04-13 DIAGNOSIS — R059 Cough, unspecified: Secondary | ICD-10-CM

## 2015-04-13 DIAGNOSIS — R05 Cough: Secondary | ICD-10-CM

## 2015-04-20 ENCOUNTER — Ambulatory Visit: Payer: Medicare Other | Admitting: Neurology

## 2015-04-26 ENCOUNTER — Telehealth: Payer: Self-pay

## 2015-04-26 ENCOUNTER — Ambulatory Visit (INDEPENDENT_AMBULATORY_CARE_PROVIDER_SITE_OTHER): Payer: Medicare Other | Admitting: Internal Medicine

## 2015-04-26 ENCOUNTER — Encounter: Payer: Self-pay | Admitting: Internal Medicine

## 2015-04-26 VITALS — BP 130/62 | HR 82 | Ht 64.5 in | Wt 166.8 lb

## 2015-04-26 DIAGNOSIS — K219 Gastro-esophageal reflux disease without esophagitis: Secondary | ICD-10-CM | POA: Diagnosis not present

## 2015-04-26 DIAGNOSIS — J410 Simple chronic bronchitis: Secondary | ICD-10-CM | POA: Diagnosis not present

## 2015-04-26 MED ORDER — FLUTICASONE FUROATE-VILANTEROL 200-25 MCG/INH IN AEPB: 1.0000 | INHALATION_SPRAY | Freq: Every day | RESPIRATORY_TRACT | Status: DC

## 2015-04-26 MED ORDER — FLUTTER DEVI: Status: AC

## 2015-04-26 NOTE — Patient Instructions (Signed)
Flutter device  Blow through 4 times per set,  3 or 4 sets per day, to help clear mucus from your airways  Sample And script Breo 200      Inhale 1 puff then rinse mouth, once daily- maintenance  Ok to use nebulizer and other meds as before  Ask Dr Kenton Kingfisher about changing from losartan for 4-6 weeks to a BP med in a different class, to see if your cough gets better

## 2015-04-26 NOTE — Telephone Encounter (Signed)
Called and spoke with pt. In Regards to flutter valve paper. States she will come by tomorrow afternoon to sign.  nothing further needed at this time.
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
## 2015-04-27 NOTE — Telephone Encounter (Signed)
Pt came by the office today around 2:30pm and signed form for [facility] and flutter Rx. Signed document and Rx were placed in [facility] green folder at [facility] door. Nothing more needed at this time.

## 2015-05-16 NOTE — Assessment & Plan Note (Signed)
I reminded reflux precautions although it is not clear how much reflux she is having or whether it has anything to do with her cough

## 2015-05-16 NOTE — Assessment & Plan Note (Addendum)
This is not a dry cough. 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 Plan-try sample Breo 200 for effect. Try flutter device. We discussed small possibility that her ARB/losartan by contributing to the cough. It is not the usual dry cough associated with that family if medication.

## 2015-09-24 ENCOUNTER — Other Ambulatory Visit: Payer: Self-pay | Admitting: Family Medicine

## 2015-09-24 DIAGNOSIS — R42 Dizziness and giddiness: Secondary | ICD-10-CM

## 2015-10-06 ENCOUNTER — Ambulatory Visit
Admission: RE | Admit: 2015-10-06 | Discharge: 2015-10-06 | Disposition: A | Discharge status: Home / Self Care | Payer: Medicare Other | Source: Ambulatory Visit | Attending: Family Medicine | Admitting: Family Medicine

## 2015-10-06 ENCOUNTER — Inpatient Hospital Stay: Admission: RE | Admit: 2015-10-06 | Payer: Medicare Other | Source: Ambulatory Visit

## 2015-10-06 DIAGNOSIS — R42 Dizziness and giddiness: Secondary | ICD-10-CM

## 2015-10-25 ENCOUNTER — Ambulatory Visit: Payer: Medicare Other | Admitting: Internal Medicine

## 2015-11-04 ENCOUNTER — Ambulatory Visit: Payer: Medicare Other | Admitting: Internal Medicine

## 2016-05-01 ENCOUNTER — Encounter: Payer: Self-pay | Admitting: Internal Medicine

## 2016-05-01 ENCOUNTER — Ambulatory Visit (INDEPENDENT_AMBULATORY_CARE_PROVIDER_SITE_OTHER): Payer: Medicare Other | Admitting: Internal Medicine

## 2016-05-01 VITALS — BP 120/72 | HR 87 | Ht 64.5 in | Wt 174.6 lb

## 2016-05-01 DIAGNOSIS — J42 Unspecified chronic bronchitis: Secondary | ICD-10-CM | POA: Diagnosis not present

## 2016-05-01 DIAGNOSIS — J00 Acute nasopharyngitis [common cold]: Secondary | ICD-10-CM

## 2016-05-01 MED ORDER — UMECLIDINIUM-VILANTEROL 62.5-25 MCG/INH IN AEPB: 1.0000 | INHALATION_SPRAY | Freq: Every day | RESPIRATORY_TRACT | 0 refills | Status: DC

## 2016-05-01 NOTE — Patient Instructions (Signed)
Sample x 2 Anoro Ellipta inhaler      Inhale 1 puff, once daily  Continue the nasal sprays  Order- office spirometry  Ok to use otc cough and sinus remedies as you find helpful

## 2016-05-01 NOTE — Progress Notes (Signed)
HPI  female never smoker followed for chronic cough, chronic bronchitis, complicated by GERD Allergy profile 03/17/2014-negative-total IgE only 3 with no specific elevations on the panel. CBC showed WBC 10,800, eosinophils 4%, PMN 69.3%  ENT dr  told possibly GERD  Office Spirometry 05/01/2016-WNL. FVC 2.57/90%, FEV1 101/94%, ratio 0.78, FEF 25-75% 1.97/120% _____________________________________________-  04/26/2015-76 year old female never smoker followed for chronic cough, chronic bronchitis, complicated by GERD FOLLOWS FOR: pt. states prod. cough clear in color is on & off. chest tightness with some wheezing that seems to be improving. No difference in symptoms if she stays at her own house or at her daughter's house. Laryngitis last week did not become an exacerbation of her bronchitis  CXR 04/13/2015  IMPRESSION: No active cardiopulmonary disease. Electronically Signed  By: Marijo Conception, M.D.  On: 04/13/2015 12:06  05/01/2016-76 year old female never smoker followed for chronic cough, chronic bronchitis, complicated by GERD FOLLOWS FOR: Pt states every year around this time she gets this deep cough-productive at times-white to darker black in color.(lasts from Oct to Feb). SOB and wheezing at times but not usually. CXR 04/13/2015 IMPRESSION: No active cardiopulmonary disease. She says she keeps a cough between October and throat were each year, usually starting as a viral illness. PCP gave Atrovent nasal spray, Z-Pak, prednisone. She is able to blow her nose now and feels better but thinks sinus drainage triggers for cough. Briel no help Denies recognizing reflux/heartburn symptoms but takes Prilosec once daily. Twice daily made no difference. Tessalon did not help cough. Office Spirometry 05/01/2016-WNL. FVC 2.57/90%, FEV1 101/94%, ratio 0.78, FEF 25-75% 1.97/120%  ROS-see HPI Constitutional:   No-   weight loss, night sweats, fevers, chills, fatigue, lassitude. HEENT:   No-   headaches, difficulty swallowing, tooth/dental problems, sore throat,       No-  sneezing, itching, ear ache,                               +nasal congestion, post nasal drip,  CV:  No-   chest pain, orthopnea, PND, swelling in lower extremities, anasarca,                                               dizziness, palpitations Resp: No-   shortness of breath with exertion or at rest.               productive cough,   + cough,  No- coughing up of blood.              No-   change in color of mucus.  No- wheezing.   Skin: No-   rash or lesions. GI:  No-   heartburn, indigestion, abdominal pain, nausea, vomiting,  GU: . MS:   Neuro-     nothing unusual Psych:  No- change in mood or affect. No depression or anxiety.  No memory loss.  OBJ- Physical Exam General- Alert, Oriented, Affect-appropriate, Distress- none acute Skin- rash-none, lesions- none, excoriation- none Lymphadenopathy- none Head- atraumatic            Eyes- Gross vision intact, PERRLA, conjunctivae and secretions clear            Ears- Hearing, canals-normal            Nose- Clear, no-Septal dev, mucus, polyps, erosion, perforation  Throat- Mallampati III , mucosa clear , drainage- none, tonsils- atrophic, hoarse + Neck- flexible , trachea midline, no stridor , thyroid nl, carotid no bruit Chest - symmetrical excursion , unlabored           Heart/CV- RRR , no murmur , no gallop  , no rub, nl s1 s2                           - JVD- none , edema- none, stasis changes- none, varices- none           Lung- clear, wheeze- none, cough + rattle , dullness-none, rub- none           Chest wall-  Abd-  Br/ Gen/ Rectal- Not done, not indicated Extrem- cyanosis- none, clubbing, none, atrophy- none, strength- nl Neuro- grossly intact to observation

## 2016-05-07 DIAGNOSIS — J310 Chronic rhinitis: Secondary | ICD-10-CM | POA: Insufficient documentation

## 2016-05-07 NOTE — Assessment & Plan Note (Signed)
She associates her cough with nasal symptoms although I don't see postnasal drainage and her cough quality sounds like lower airway secretions. Plan-continue nasal spray. We need to consider if she needs a CT of her sinuses if this continues.

## 2016-05-07 NOTE — Assessment & Plan Note (Addendum)
Cough is obvious but otherwise chest exam is unremarkable and she seems comfortable. I do not see postnasal drainage. Office spirometry within normal limits at this visit. Plan-sample Anoro  inhaler

## 2016-05-11 ENCOUNTER — Telehealth: Payer: Self-pay | Admitting: Internal Medicine

## 2016-05-11 MED ORDER — BENZONATATE 200 MG PO CAPS: 200.0000 mg | ORAL_CAPSULE | Freq: Three times a day (TID) | ORAL | 12 refills | Status: DC | PRN

## 2016-05-11 NOTE — Telephone Encounter (Signed)
lmomtcb x1 

## 2016-05-11 NOTE — Telephone Encounter (Signed)
Pt returning call.Yolanda Hill ° °

## 2016-05-11 NOTE — Telephone Encounter (Signed)
Rx sent to preferred pharmacy. Pt aware & voiced her understanding.  Nothing further needed.  

## 2016-05-11 NOTE — Telephone Encounter (Signed)
Tessalon perles 200 mg, # 30, 1 every 8 hours if needed for cough, refill x 12

## 2016-05-11 NOTE — Telephone Encounter (Signed)
Spoke with pt, who is requesting Rx for tessalon pearls. Pt states she has had a lingering prod cough with white to yellow mucus since October.  Pt states she has tried delsym with no improvement. Pt denies any other pulmonary symptoms.  CY please advise on Rx. Thanks.

## 2016-06-09 ENCOUNTER — Other Ambulatory Visit: Payer: Self-pay | Admitting: Family Medicine

## 2016-06-09 ENCOUNTER — Ambulatory Visit
Admission: RE | Admit: 2016-06-09 | Discharge: 2016-06-09 | Disposition: A | Discharge status: Home / Self Care | Payer: Medicare Other | Source: Ambulatory Visit | Attending: Family Medicine | Admitting: Family Medicine

## 2016-06-09 DIAGNOSIS — J40 Bronchitis, not specified as acute or chronic: Secondary | ICD-10-CM

## 2016-06-29 ENCOUNTER — Ambulatory Visit
Admission: RE | Admit: 2016-06-29 | Discharge: 2016-06-29 | Disposition: A | Discharge status: Home / Self Care | Payer: Medicare Other | Source: Ambulatory Visit | Attending: Family Medicine | Admitting: Family Medicine

## 2016-06-29 ENCOUNTER — Other Ambulatory Visit: Payer: Self-pay | Admitting: Family Medicine

## 2016-06-29 DIAGNOSIS — R9389 Abnormal findings on diagnostic imaging of other specified body structures: Secondary | ICD-10-CM

## 2016-09-26 ENCOUNTER — Emergency Department (HOSPITAL_COMMUNITY)
Admission: EM | Admit: 2016-09-26 | Discharge: 2016-09-26 | Disposition: A | Discharge status: Home / Self Care | Payer: Medicare Other | Attending: Emergency Medicine | Admitting: Emergency Medicine

## 2016-09-26 ENCOUNTER — Encounter (HOSPITAL_COMMUNITY): Payer: Self-pay | Admitting: Nurse Practitioner

## 2016-09-26 ENCOUNTER — Emergency Department (HOSPITAL_COMMUNITY): Payer: Medicare Other

## 2016-09-26 DIAGNOSIS — R110 Nausea: Secondary | ICD-10-CM | POA: Insufficient documentation

## 2016-09-26 DIAGNOSIS — R1030 Lower abdominal pain, unspecified: Secondary | ICD-10-CM | POA: Diagnosis not present

## 2016-09-26 DIAGNOSIS — Z79899 Other long term (current) drug therapy: Secondary | ICD-10-CM | POA: Diagnosis not present

## 2016-09-26 DIAGNOSIS — R109 Unspecified abdominal pain: Secondary | ICD-10-CM

## 2016-09-26 DIAGNOSIS — J45909 Unspecified asthma, uncomplicated: Secondary | ICD-10-CM | POA: Insufficient documentation

## 2016-09-26 LAB — COMPREHENSIVE METABOLIC PANEL
ALT: 41 U/L (ref 14–54)
ANION GAP: 12 (ref 5–15)
AST: 37 U/L (ref 15–41)
Albumin: 4.5 g/dL (ref 3.5–5.0)
Alkaline Phosphatase: 86 U/L (ref 38–126)
BILIRUBIN TOTAL: 0.8 mg/dL (ref 0.3–1.2)
BUN: 13 mg/dL (ref 6–20)
CO2: 23 mmol/L (ref 22–32)
CREATININE: 0.92 mg/dL (ref 0.44–1.00)
Calcium: 9.1 mg/dL (ref 8.9–10.3)
Chloride: 97 mmol/L — ABNORMAL LOW (ref 101–111)
GFR calc non Af Amer: 59 mL/min — ABNORMAL LOW (ref >60)
Glucose, Bld: 119 mg/dL — ABNORMAL HIGH (ref 65–99)
Potassium: 3.8 mmol/L (ref 3.5–5.1)
SODIUM: 132 mmol/L — AB (ref 135–145)
TOTAL PROTEIN: 8.2 g/dL — AB (ref 6.5–8.1)

## 2016-09-26 LAB — CBC WITH DIFFERENTIAL/PLATELET
Basophils Absolute: 0 10*3/uL (ref 0.0–0.1)
Basophils Relative: 0 %
EOS ABS: 0.1 10*3/uL (ref 0.0–0.7)
Eosinophils Relative: 1 %
HCT: 43.7 % (ref 36.0–46.0)
HEMOGLOBIN: 15 g/dL (ref 12.0–15.0)
LYMPHS ABS: 1.4 10*3/uL (ref 0.7–4.0)
LYMPHS PCT: 10 %
MCH: 31.9 pg (ref 26.0–34.0)
MCHC: 34.3 g/dL (ref 30.0–36.0)
MCV: 93 fL (ref 78.0–100.0)
MONOS PCT: 6 %
Monocytes Absolute: 0.8 10*3/uL (ref 0.1–1.0)
NEUTROS ABS: 12.4 10*3/uL — AB (ref 1.7–7.7)
NEUTROS PCT: 83 %
Platelets: 289 10*3/uL (ref 150–400)
RBC: 4.7 MIL/uL (ref 3.87–5.11)
RDW: 15.2 % (ref 11.5–15.5)
WBC: 14.8 10*3/uL — AB (ref 4.0–10.5)

## 2016-09-26 LAB — URINALYSIS, ROUTINE W REFLEX MICROSCOPIC
Bilirubin Urine: NEGATIVE
Glucose, UA: NEGATIVE mg/dL
Hgb urine dipstick: NEGATIVE
Ketones, ur: NEGATIVE mg/dL
Leukocytes, UA: NEGATIVE
Nitrite: NEGATIVE
Protein, ur: NEGATIVE mg/dL
Specific Gravity, Urine: 1.012 (ref 1.005–1.030)
pH: 7 (ref 5.0–8.0)

## 2016-09-26 LAB — LIPASE, BLOOD: Lipase: 22 U/L (ref 11–51)

## 2016-09-26 MED ORDER — SODIUM CHLORIDE 0.9 % IV BOLUS (SEPSIS)
1000.0000 mL | Freq: Once | INTRAVENOUS | Status: AC
  Administered 2016-09-26: 1000 mL via INTRAVENOUS

## 2016-09-26 MED ORDER — IOPAMIDOL (ISOVUE-300) INJECTION 61%
INTRAVENOUS | Status: AC
  Administered 2016-09-26: 1 mL via INTRAVENOUS
  Filled 2016-09-26: qty 100

## 2016-09-26 MED ORDER — IOPAMIDOL (ISOVUE-300) INJECTION 61%
100.0000 mL | Freq: Once | INTRAVENOUS | Status: AC | PRN
  Administered 2016-09-26: 1 mL via INTRAVENOUS
  Administered 2016-09-26: 100 mL via INTRAVENOUS

## 2016-09-26 NOTE — Discharge Instructions (Signed)
Continue taking her medications as prescribed. Continue drinking fluids at home to remain hydrated. I recommend using a bland diet for the next few days until her symptoms have improved. Follow-up with your primary care provider in the next 3-4 days for follow up evaluation. Return to the emergency department if symptoms worsen or new onset of fever, dizziness, chest pain, shortness of breath, new/worsening abdominal pain, vomiting, unable to keep fluids down, rectal bleeding/blood in stool, syncope.

## 2016-09-26 NOTE — ED Triage Notes (Signed)
Patient coming from home she woke up at 7am with abdominal tenderness with cramping no vomiting or diarrhea but did have dizziness and nausea. Patient is A/O x4 no headache. Patient has a history of diverticulitis. This pain feels similar but a little worse than usual. Patient was given zofran by EMS.

## 2016-09-26 NOTE — ED Provider Notes (Signed)
[facility] DEPT Provider Note   CSN: [id number] Arrival date & time: 09/26/16  1128     History   Chief Complaint No chief complaint on file.   HPI Yolanda Hill is a 76 y.o. female.  HPI   Pt is a 76 yo female with PMH of HTN, HLD, diverticulitis, asthma, GERD Who presents the ED with complaint of abdominal pain. Patient reports when she woke up this point she began having intermittent sharp cramping pain to her lower abdomen with associated nausea. She states after taking her morning medications and trying to have something little to eat her belly pain worsen resulting her going to the bathroom. She denies having any episodes of diarrhea or vomiting but states her abdominal pain and nausea continued to worsen resulting in her becoming diaphoretic. Patient reports her abdominal pain has since improved. Denies taking any medications prior to arrival for pain. She reports having similar episode of abdominal pain in the past when she was diagnosed with diverticulitis. Patient also endorses having mild dysuria. Denies fever, chills, headache, dizziness, shortness of breath, chest pain, vomiting, diarrhea, constipation, blood in urine or stool, vaginal bleeding or discharge, flank pain, syncope. Endorses abdominal surgical history of cholecystectomy, appendectomy and partial hysterectomy. Patient notes she is finishing her prescriptions of steroid and antibiotics for an upper respiratory infection she was diagnosed with last week by her PCP with improvement of sxs.  PCP- Dr. Kenton Kingfisher  Past Medical History:  Diagnosis Date  . Arthritis    osteoarthritis, cervical and lumbar  . Asthma   . Diverticulitis   . GERD (gastroesophageal reflux disease)   . Headache disorder 12/01/2014  . Headache(784.0)   . High blood pressure   . High cholesterol   . Insomnia   . Meniere disease   . Seasonal allergies     Patient Active Problem List   Diagnosis Date Noted  . Rhinitis 05/07/2016  .  Headache disorder 12/01/2014  . GERD (gastroesophageal reflux disease) 11/22/2014  . Special screening for malignant neoplasms, colon 02/19/2012  . Diverticulitis 09/08/2011  . Chronic bronchitis ([place]) 02/23/2011    Past Surgical History:  Procedure Laterality Date  . ABDOMINAL HYSTERECTOMY  1971   partial  . APPENDECTOMY  1971   during hysterectomy  . BRAVO [place] STUDY  09/29/2011   Procedure: BRAVO [place] STUDY;  Surgeon: Inda Castle, MD;  Location: [facility] ENDOSCOPY;  Service: Endoscopy;  Laterality: N/A;  . CHOLECYSTECTOMY     1989  . ESOPHAGOGASTRODUODENOSCOPY  09/29/2011   Procedure: ESOPHAGOGASTRODUODENOSCOPY (EGD);  Surgeon: Inda Castle, MD;  Location: [facility]OS[facility];  Service: Endoscopy;  Laterality: N/A;  pt. to stay on ppi  . GALLBLADDER SURGERY  1989  . PARTIAL HYSTERECTOMY  1971    OB History    No data available       Home Medications    Prior to Admission medications   Medication Sig Start Date End Date Taking? Authorizing Provider  acetaminophen (TYLENOL) 500 MG tablet Take 1,000 mg by mouth daily as needed.   Yes [provider]  albuterol (ACCUNEB) 0.63 MG/3ML nebulizer solution USE 1 VIAL  VIA NEBULAZER Q 6 H PRN 10/30/14  Yes [provider]  b complex vitamins tablet Take 1 tablet by mouth daily.   Yes [provider]  Calcium Carbonate-Vitamin D (CALCIUM-VITAMIN D) 500-200 MG-UNIT per tablet Take 1 tablet by mouth daily.   Yes [provider]  Chlorphen-Pseudoephed-APAP (THERAFLU FLU/COLD PO) Take 1 tablet by mouth daily as  needed (cold and allergies).    Yes [provider]  cholecalciferol (VITAMIN D) 1000 UNITS tablet Take 1,000 Units by mouth daily.   Yes [provider]  fluconazole (DIFLUCAN) 150 MG tablet Take 150 mg by mouth once. 09/25/16  Yes [provider]  fluticasone (FLONASE) 50 MCG/ACT nasal spray Place into both nostrils daily.   Yes [provider]  gabapentin (NEURONTIN) 300  MG capsule Take 1 capsule by mouth at bedtime.  04/22/14  Yes [provider]  ipratropium (ATROVENT) 0.06 % nasal spray Place 2 sprays into both nostrils 4 (four) times daily.   Yes [provider]  levofloxacin (LEVAQUIN) 500 MG tablet Take 500 mg by mouth daily. 09/19/16  Yes [provider]  levothyroxine (SYNTHROID, LEVOTHROID) 50 MCG tablet Take 50 mcg by mouth daily. 09/07/16  Yes [provider]  LORazepam (ATIVAN) 0.5 MG tablet Take 0.5 mg by mouth every 6 (six) hours as needed for anxiety.    Yes [provider]  losartan-hydrochlorothiazide (HYZAAR) 50-12.5 MG per tablet Take 1 tablet by mouth daily. 12/06/10  Yes [provider]  Multiple Vitamin (MULTIVITAMIN) capsule Take 1 capsule by mouth daily.   Yes [provider]  omeprazole (PRILOSEC) 40 MG capsule Take 40 mg by mouth at bedtime. 08/01/16  Yes [provider]  predniSONE (DELTASONE) 10 MG tablet 4,4,3,3,2,2,1,1 taper 09/19/16  Yes [provider]  Respiratory Therapy Supplies (FLUTTER) DEVI Blow through 4 times per set, three sets daily 04/26/15  Yes Young, Clinton D, MD  Simethicone (GAS-X PO) Take 1 tablet by mouth at bedtime.   Yes [provider]  umeclidinium-vilanterol (ANORO ELLIPTA) 62.5-25 MCG/INH AEPB Inhale 1 puff into the lungs daily. 05/01/16  Yes Young, Tarri Fuller D, MD  zolpidem (AMBIEN) 10 MG tablet Take 5-10 mg by mouth At bedtime as needed for sleep.  02/02/11  Yes [provider]  benzonatate (TESSALON) 200 MG capsule Take 1 capsule (200 mg total) by mouth 3 (three) times daily as needed for cough. Patient not taking: Reported on 09/26/2016 05/11/16   Deneise Lever, MD    Family History Family History  Problem Relation Age of Onset  . Alcohol abuse Father   . Kidney disease Father   . Heart disease Mother   . Diabetes Sister   . Stroke Sister   . Depression Sister   . Emphysema Paternal Grandfather   . Heart  disease Unknown        both sides of family    Social History Social History  Substance Use Topics  . Smoking status: Never Smoker  . Smokeless tobacco: Never Used  . Alcohol use Yes     Comment: rarely     Allergies   Codeine; Dymista [azelastine-fluticasone]; and Simvastatin   Review of Systems Review of Systems  Gastrointestinal: Positive for abdominal pain and nausea.  All other systems reviewed and are negative.    Physical Exam Updated Vital Signs BP (!) 182/88 (BP Location: Right Arm)   Pulse 72   Temp 98.8 F (37.1 C) (Oral)   Resp 16   SpO2 99%   Physical Exam  Constitutional: She is oriented to person, place, and time. She appears well-developed and well-nourished. No distress.  HENT:  Head: Normocephalic and atraumatic.  Mouth/Throat: Oropharynx is clear and moist. No oropharyngeal exudate.  Eyes: Conjunctivae and EOM are normal. Right eye exhibits no discharge. Left eye exhibits no discharge. No scleral icterus.  Neck: Normal range of motion. Neck  supple.  Cardiovascular: Normal rate, regular rhythm, normal heart sounds and intact distal pulses.   Pulmonary/Chest: Effort normal and breath sounds normal. No respiratory distress. She has no wheezes. She has no rales. She exhibits no tenderness.  Abdominal: Soft. Normal appearance and bowel sounds are normal. She exhibits no distension and no mass. There is tenderness. There is no rigidity, no rebound, no guarding and no CVA tenderness. No hernia.  Mild diffuse abdominal tenderness, slightly worse over lower quadrants  Musculoskeletal: Normal range of motion. She exhibits no edema.  Neurological: She is alert and oriented to person, place, and time.  Skin: Skin is warm and dry. She is not diaphoretic.  Nursing note and vitals reviewed.    ED Treatments / Results  Labs (all labs ordered are listed, but only abnormal results are displayed) Labs Reviewed  CBC WITH DIFFERENTIAL/PLATELET - Abnormal; Notable  for the following:       Result Value   WBC 14.8 (*)    Neutro Abs 12.4 (*)    All other components within normal limits  COMPREHENSIVE METABOLIC PANEL - Abnormal; Notable for the following:    Sodium 132 (*)    Chloride 97 (*)    Glucose, Bld 119 (*)    Total Protein 8.2 (*)    GFR calc non Af Amer 59 (*)    All other components within normal limits  URINALYSIS, ROUTINE W REFLEX MICROSCOPIC  LIPASE, BLOOD  CBC WITH DIFFERENTIAL/PLATELET    EKG  EKG Interpretation None       Radiology Ct Abdomen Pelvis W Contrast  Result Date: 09/26/2016 CLINICAL DATA:  Lower abdomen pain, some nausea EXAM: CT ABDOMEN AND PELVIS WITH CONTRAST TECHNIQUE: Multidetector CT imaging of the abdomen and pelvis was performed using the standard protocol following bolus administration of intravenous contrast. CONTRAST:  19mL ISOVUE-300 IOPAMIDOL (ISOVUE-300) INJECTION 61% COMPARISON:  CT abdomen pelvis of 09/12/2011 FINDINGS: Lower chest: Linear atelectasis or scarring is noted the right lower lobe posterior medially. No pneumonia or effusion is seen. Hepatobiliary: The liver enhances with no focal abnormality and no ductal dilatation is seen. Surgical clips are present from prior cholecystectomy. Pancreas: The pancreas is normal in size and the pancreatic duct is not dilated. Spleen: The spleen is unremarkable. Adrenals/Urinary Tract: The adrenal glands appear normal. The kidneys enhance with no calculus or mass. On delayed images, the pelvocaliceal systems are unremarkable and the ureters are normal in caliber. The urinary bladder is not optimally distended but grossly is unremarkable. Stomach/Bowel: The stomach is moderately distended with oral contrast with no abnormality noted. No small bowel distention is seen. 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 There multiple rectosigmoid and distal descending colon diverticula present. There is some thickening of the mucosa of the sigmoid colon which  could indicate mild edema but no definite diverticulitis is seen. No abscess is noted. The terminal ileum is unremarkable. Vascular/Lymphatic: The abdominal aorta is normal in caliber with moderate abdominal aortic atherosclerosis present. No adenopathy is seen. Reproductive: The uterus has previously been resected. No adnexal lesion is seen and no free fluid is noted within the pelvis. Other: None. Musculoskeletal: The lumbar vertebrae are in normal alignment. There is degenerative change throughout the facet joints of the lumbar spine diffusely. IMPRESSION: 1. No definite diverticulitis is seen although there may be a mild mucosal edema of the sigmoid colon where multiple diverticula are present. 2. Moderate abdominal aortic atherosclerosis. Electronically Signed   By: Windy Canny.D.  On: 09/26/2016 14:47    Procedures Procedures (including critical care time)  Medications Ordered in ED Medications  sodium chloride 0.9 % bolus 1,000 mL (0 mLs Intravenous Stopped 09/26/16 1449)  iopamidol (ISOVUE-300) 61 % injection 100 mL (1 mL Intravenous Contrast Given 09/26/16 1450)     Initial Impression / Assessment and Plan / ED Course  I have reviewed the triage vital signs and the nursing notes.  Pertinent labs & imaging results that were available during my care of the patient were reviewed by me and considered in my medical decision making (see chart for details).    Pt presents with lower abdominal cramping with associated nausea. Reports pain feels similar to episode of diverticulitis she has in the past. Denies fever, vomiting, diarrhea, CP, SOB. VSS. Exam showed mild diffuse abdominal tenderness, slightly worse over lower quadrants. Pt declined pain meds and zofran. Pt given IVF. WBC 14.8, suspect mild leukocytosis is likely due to pt's recent rx of steroids she is on for URI. Remaining labs and urine unremarkable. CT abdomen showed mild mucosal edema of sigmoid colon with multiple diverticula  present but no evidence of diverticulitis. Discussed pt with Dr. Vanita Panda. On reevaluation pt reports feeling better. Tolerating PO. Discussed results and plan for d/c with pt. Plan to d/c pt home with symptomatic and close PCP follow up.    Final Clinical Impressions(s) / ED Diagnoses   Final diagnoses:  Abdominal pain, unspecified abdominal location  Nausea    New Prescriptions New Prescriptions   No medications on file     Nona Dell, Hershal Coria 09/26/16 1524    Carmin Muskrat, MD 09/27/16 1606

## 2016-10-30 ENCOUNTER — Ambulatory Visit (INDEPENDENT_AMBULATORY_CARE_PROVIDER_SITE_OTHER): Payer: Medicare Other | Admitting: Internal Medicine

## 2016-10-30 ENCOUNTER — Encounter: Payer: Self-pay | Admitting: Internal Medicine

## 2016-10-30 VITALS — BP 138/80 | HR 72 | Ht 64.5 in | Wt 174.4 lb

## 2016-10-30 DIAGNOSIS — K219 Gastro-esophageal reflux disease without esophagitis: Secondary | ICD-10-CM

## 2016-10-30 DIAGNOSIS — J479 Bronchiectasis, uncomplicated: Secondary | ICD-10-CM | POA: Diagnosis not present

## 2016-10-30 DIAGNOSIS — J418 Mixed simple and mucopurulent chronic bronchitis: Secondary | ICD-10-CM | POA: Diagnosis not present

## 2016-10-30 MED ORDER — FLUTICASONE-UMECLIDIN-VILANT 100-62.5-25 MCG/INH IN AEPB: 1.0000 | INHALATION_SPRAY | Freq: Every day | RESPIRATORY_TRACT | 0 refills | Status: DC

## 2016-10-30 NOTE — Patient Instructions (Signed)
Samples x 2 Trelegy Ellipta      Inhale 1 puff then rinse mouth, once daily     Try this instead of Anoro. When the samples are used up, go back to [employer] for comparison  Order- CT chest High Res, no contrast    Dx bronchiectasis  Recommend- continue omeprazole every evening                           Add otc Pepcid, 1 each morning before breakfast. Try this for 1 month  Don't lie down for an hour after eating, to reduce the tendency to reflux.

## 2016-10-30 NOTE — Assessment & Plan Note (Signed)
She has no feeling that she is refluxing and denies associated cough with supine position or eating. Plan-emphasized reflux precautions. Continue omeprazole at night and add Pepcid OTC in the morning before breakfast for one month trial

## 2016-10-30 NOTE — Assessment & Plan Note (Signed)
Chronic bronchitis with exacerbation. Plan-CT chest high resolution looking for bronchiectasis. Try changing Anoro samples of Trelegy to add inhaled steroid.

## 2016-10-30 NOTE — Progress Notes (Signed)
HPI  female never smoker followed for chronic cough, chronic bronchitis, complicated by GERD Allergy profile 03/17/2014-negative-total IgE only 3 with no specific elevations on the panel. CBC showed WBC 10,800, eosinophils 4%, PMN 69.3%  ENT dr  told possibly GERD  Office Spirometry 05/01/2016-WNL. FVC 2.57/90%, FEV1 101/94%, ratio 0.78, FEF 25-75% 1.97/120% _____________________________________________-  04/26/2015-76 year old female never smoker followed for chronic cough, chronic bronchitis, complicated by GERD FOLLOWS FOR: pt. states prod. cough clear in color is on & off. chest tightness with some wheezing that seems to be improving. No difference in symptoms if she stays at her own house or at her daughter's house. Laryngitis last week did not become an exacerbation of her bronchitis  CXR 04/13/2015  IMPRESSION: No active cardiopulmonary disease. Electronically Signed  By: Marijo Conception, M.D.  On: 04/13/2015 12:06  05/01/2016-76 year old female never smoker followed for chronic cough, chronic bronchitis, complicated by GERD FOLLOWS FOR: Pt states every year around this time she gets this deep cough-productive at times-white to darker black in color.(lasts from Oct to Feb). SOB and wheezing at times but not usually. CXR 04/13/2015 IMPRESSION: No active cardiopulmonary disease. She says she keeps a cough between October and February each year, usually starting as a viral illness. PCP gave Atrovent nasal spray, Z-Pak, prednisone. She is able to blow her nose now and feels better but thinks sinus drainage triggers for cough. Breo no help Denies recognizing reflux/heartburn symptoms but takes Prilosec once daily. Twice daily made no difference. Tessalon did not help cough. Office Spirometry 05/01/2016-WNL. FVC 2.57/90%, FEV1 101/94%, ratio 0.78, FEF 25-75% 1.97/120%  10/30/16- 76 year old female never smoker followed for chronic cough, chronic bronchitis, complicated by GERD FOLLOWS  FOR: Pt states she has had bad days since last visit; recent ER visit for viral bug as well. Cough-productive more from right side of chest/lung. Went to ENT last week and was told Acid reflux and caffiene is causing this cough-pt is weaning off caffiene-no changes in cough. Anoro, albuterol, omeprazole 40  She rarely feels any reflux, denies cough waking her at night and denies cough or choking while eating. She does try to avoid lying down after eating. Cough produces clear mucus. Tessalon didn't help. Using throat lozenges. Avoids going to church because cough is embarrassing. CXR 06/29/16 IMPRESSION: No active cardiopulmonary disease.  ROS-see HPI   + = pos Constitutional:   No-   weight loss, night sweats, fevers, chills, fatigue, lassitude. HEENT:   No-  headaches, difficulty swallowing, tooth/dental problems, sore throat,       No-  sneezing, itching, ear ache,                               +nasal congestion, post nasal drip,  CV:  No-   chest pain, orthopnea, PND, swelling in lower extremities, anasarca,                                               dizziness, palpitations Resp: No-   shortness of breath with exertion or at rest.               productive cough,   + cough,  No- coughing up of blood.              No-   change in color of mucus.  No-  wheezing.   Skin: No-   rash or lesions. GI:  No-   heartburn, indigestion, abdominal pain, nausea, vomiting,  GU: . MS:   Neuro-     nothing unusual Psych:  No- change in mood or affect. No depression or anxiety.  No memory loss.  OBJ- Physical Exam General- Alert, Oriented, Affect-appropriate, Distress- none acute Skin- rash-none, lesions- none, excoriation- none Lymphadenopathy- none Head- atraumatic            Eyes- Gross vision intact, PERRLA, conjunctivae and secretions clear            Ears- Hearing, canals-normal            Nose- Clear, no-Septal dev, mucus, polyps, erosion, perforation             Throat- Mallampati III ,  mucosa clear , drainage- none, tonsils- atrophic, hoarse + Neck- flexible , trachea midline, no stridor , thyroid nl, carotid no bruit Chest - symmetrical excursion , unlabored           Heart/CV- RRR , no murmur , no gallop  , no rub, nl s1 s2                           - JVD- none , edema- none, stasis changes- none, varices- none           Lung-  wheeze- none, cough + raspy bronchitic cough with coarse air sounds especially right lung, , dullness-none, rub- none           Chest wall-  Abd-  Br/ Gen/ Rectal- Not done, not indicated Extrem- cyanosis- none, clubbing, none, atrophy- none, strength- nl Neuro- grossly intact to observation

## 2016-11-07 ENCOUNTER — Encounter (INDEPENDENT_AMBULATORY_CARE_PROVIDER_SITE_OTHER): Payer: Self-pay

## 2016-11-07 ENCOUNTER — Ambulatory Visit (INDEPENDENT_AMBULATORY_CARE_PROVIDER_SITE_OTHER)
Admission: RE | Admit: 2016-11-07 | Discharge: 2016-11-07 | Disposition: A | Discharge status: Home / Self Care | Payer: Medicare Other | Source: Ambulatory Visit | Attending: Internal Medicine | Admitting: Internal Medicine

## 2016-11-07 DIAGNOSIS — J479 Bronchiectasis, uncomplicated: Secondary | ICD-10-CM

## 2016-11-09 ENCOUNTER — Telehealth: Payer: Self-pay | Admitting: Internal Medicine

## 2016-11-09 NOTE — Progress Notes (Signed)
Pt aware per 8.2.18 phone note

## 2016-11-09 NOTE — Telephone Encounter (Signed)
Per 7.31.18 CT resultsResult Notes  Notes recorded by Lorane Gell, CMA on 11/08/2016 at 4:40 PM EDT lmtcb ------  Notes recorded by Deneise Lever, MD on 11/08/2016 at 10:16 AM EDT CT chest- shows very slight fibrotic scarring and a tiny nodule, as well as some atherosclerosis in the arteries. We will go over this report in detail at next visit, but there is nothing more to do right now.    Called spoke with patient, advised of CT Chest results/recs as stated by CY.  Pt voiced her understanding and denied any questions/concerns at this time.  Pt aware to contact the office with any questions/concerns.  Nothing further needed at this time; will sign off.

## 2017-03-08 ENCOUNTER — Ambulatory Visit: Payer: Medicare Other | Admitting: Internal Medicine

## 2017-03-08 ENCOUNTER — Ambulatory Visit (INDEPENDENT_AMBULATORY_CARE_PROVIDER_SITE_OTHER)
Admission: RE | Admit: 2017-03-08 | Discharge: 2017-03-08 | Disposition: A | Discharge status: Home / Self Care | Payer: Medicare Other | Source: Ambulatory Visit | Attending: Internal Medicine | Admitting: Internal Medicine

## 2017-03-08 ENCOUNTER — Encounter: Payer: Self-pay | Admitting: Internal Medicine

## 2017-03-08 VITALS — BP 118/68 | HR 74 | Ht 64.5 in | Wt 167.0 lb

## 2017-03-08 DIAGNOSIS — R053 Chronic cough: Secondary | ICD-10-CM

## 2017-03-08 DIAGNOSIS — K219 Gastro-esophageal reflux disease without esophagitis: Secondary | ICD-10-CM

## 2017-03-08 DIAGNOSIS — J410 Simple chronic bronchitis: Secondary | ICD-10-CM | POA: Diagnosis not present

## 2017-03-08 DIAGNOSIS — J849 Interstitial pulmonary disease, unspecified: Secondary | ICD-10-CM | POA: Diagnosis not present

## 2017-03-08 DIAGNOSIS — R05 Cough: Secondary | ICD-10-CM | POA: Diagnosis not present

## 2017-03-08 NOTE — Patient Instructions (Signed)
CXR- dx chronic cough  Order- schedule future CT chest high resolution in 12 months     Dx interstitial lung disease  Keep trying to avoid reflux- don't lie down with a full stomach  Please call if we can help

## 2017-03-08 NOTE — Progress Notes (Signed)
HPI  female never smoker followed for chronic cough, chronic bronchitis, complicated by GERD Allergy profile 03/17/2014-negative-total IgE only 3 with no specific elevations on the panel. CBC showed WBC 10,800, eosinophils 4%, PMN 69.3%  ENT dr  told possibly GERD  Office Spirometry 05/01/2016-WNL. FVC 2.57/90%, FEV1 101/94%, ratio 0.78, FEF 25-75% 1.97/120% _____________________________________________-  10/30/16- 76 year old female never smoker followed for chronic cough, chronic bronchitis, complicated by GERD FOLLOWS FOR: Pt states she has had bad days since last visit; recent ER visit for viral bug as well. Cough-productive more from right side of chest/lung. Went to ENT last week and was told Acid reflux and caffiene is causing this cough-pt is weaning off caffiene-no changes in cough. Anoro, albuterol, omeprazole 40  She rarely feels any reflux, denies cough waking her at night and denies cough or choking while eating. She does try to avoid lying down after eating. Cough produces clear mucus. Tessalon didn't help. Using throat lozenges. Avoids going to church because cough is embarrassing. CXR 06/29/16 IMPRESSION: No active cardiopulmonary disease.  03/08/17- 76 year old female never smoker followed for chronic cough, chronic bronchitis, complicated by GERD ---Chronic Bronchitis: Pt continues to have productive cough-slight yellow at times. Denies any wheezing or SOB with the cough. Pt has had trouble with losing her voice.  Anoro, [facility] ENT evaluated larynx and suggested it was reflux.  Chronic cough is better but not gone.  Sputum mostly white..  Sleeps on extra pillows now. We reviewed chest CT and discussed minimal interstitial markings. CT chest 11/07/16 IMPRESSION: 1. There are very subtle changes in the lung bases, which could be indicative of early interstitial lung disease such as nonspecific interstitial pneumonia (NSIP). Repeat high-resolution chest CT is suggested in 12  months to assess for temporal changes in the appearance of the lung parenchyma. 2. No bronchiectasis noted at this time. 3. 4 mm right lower lobe pulmonary nodule (axial image 86 of series 3). No follow-up needed if patient is low-risk. Non-contrast chest CT can be considered in 12 months if patient is high-risk. This recommendation follows the consensus statement: Guidelines for Management of Incidental Pulmonary Nodules Detected on CT Images: From the Fleischner Society 2017; Radiology 2017; 284:228-243. 4. Aortic atherosclerosis, in addition to 2 vessel coronary artery disease. Assessment for potential risk factor modification, dietary therapy or pharmacologic therapy may be warranted, if clinically indicated. Aortic Atherosclerosis (ICD10-I70.0).  ROS-see HPI   + = pos Constitutional:   No-   weight loss, night sweats, fevers, chills, fatigue, lassitude. HEENT:   No-  headaches, difficulty swallowing, tooth/dental problems, sore throat,       No-  sneezing, itching, ear ache,                               +nasal congestion, post nasal drip,  CV:  No-   chest pain, orthopnea, PND, swelling in lower extremities, anasarca,                                               dizziness, palpitations Resp: No-   shortness of breath with exertion or at rest.               productive cough,   + cough,  No- coughing up of blood.  No-   change in color of mucus.  No- wheezing.   Skin: No-   rash or lesions. GI:  No-   heartburn, indigestion, abdominal pain, nausea, vomiting,  GU: . MS:   Neuro-     nothing unusual Psych:  No- change in mood or affect. No depression or anxiety.  No memory loss.  OBJ- Physical Exam General- Alert, Oriented, Affect-appropriate, Distress- none acute Skin- rash-none, lesions- none, excoriation- none Lymphadenopathy- none Head- atraumatic            Eyes- Gross vision intact, PERRLA, conjunctivae and secretions clear            Ears- Hearing,  canals-normal            Nose- Clear, no-Septal dev, mucus, polyps, erosion, perforation             Throat- Mallampati III , mucosa clear , drainage- none, tonsils- atrophic, hoarse + Neck- flexible , trachea midline, no stridor , thyroid nl, carotid no bruit Chest - symmetrical excursion , unlabored           Heart/CV- RRR , no murmur , no gallop  , no rub, nl s1 s2                           - JVD- none , edema- none, stasis changes- none, varices- none           Lung-clear/unlabored, wheeze- none, cough-none, , dullness-none, rub- none           Chest wall-  Abd-  Br/ Gen/ Rectal- Not done, not indicated Extrem- cyanosis- none, clubbing, none, atrophy- none, strength- nl Neuro- grossly intact to observation

## 2017-03-09 NOTE — Assessment & Plan Note (Signed)
Chest CT suggested "very subtle" interstitial changes suggestive of NSIP.  I reviewed images.  We agreed to repeat CT in a year.

## 2017-03-09 NOTE — Assessment & Plan Note (Signed)
Emphasized reflux precautions even though she does not feel she is refluxing.

## 2017-03-13 ENCOUNTER — Telehealth: Payer: Self-pay | Admitting: Internal Medicine

## 2017-03-13 NOTE — Telephone Encounter (Signed)
Notes recorded by Deneise Lever, MD on 03/09/2017 at 2:21 PM EST CXR- stable old scarring. Scoliosis in spine. No new or active process  Advised pt of results. Pt understood and nothing further is needed.

## 2017-04-12 ENCOUNTER — Encounter: Payer: Self-pay | Admitting: Gastroenterology

## 2017-05-22 IMAGING — CR DG CHEST 2V
2 series · 2 of 2 positions shown · non-contrast
Comparison: 04/13/2015 .

CLINICAL DATA: Chronic cough.

EXAM:
CHEST  2 VIEW

[w chest pa]
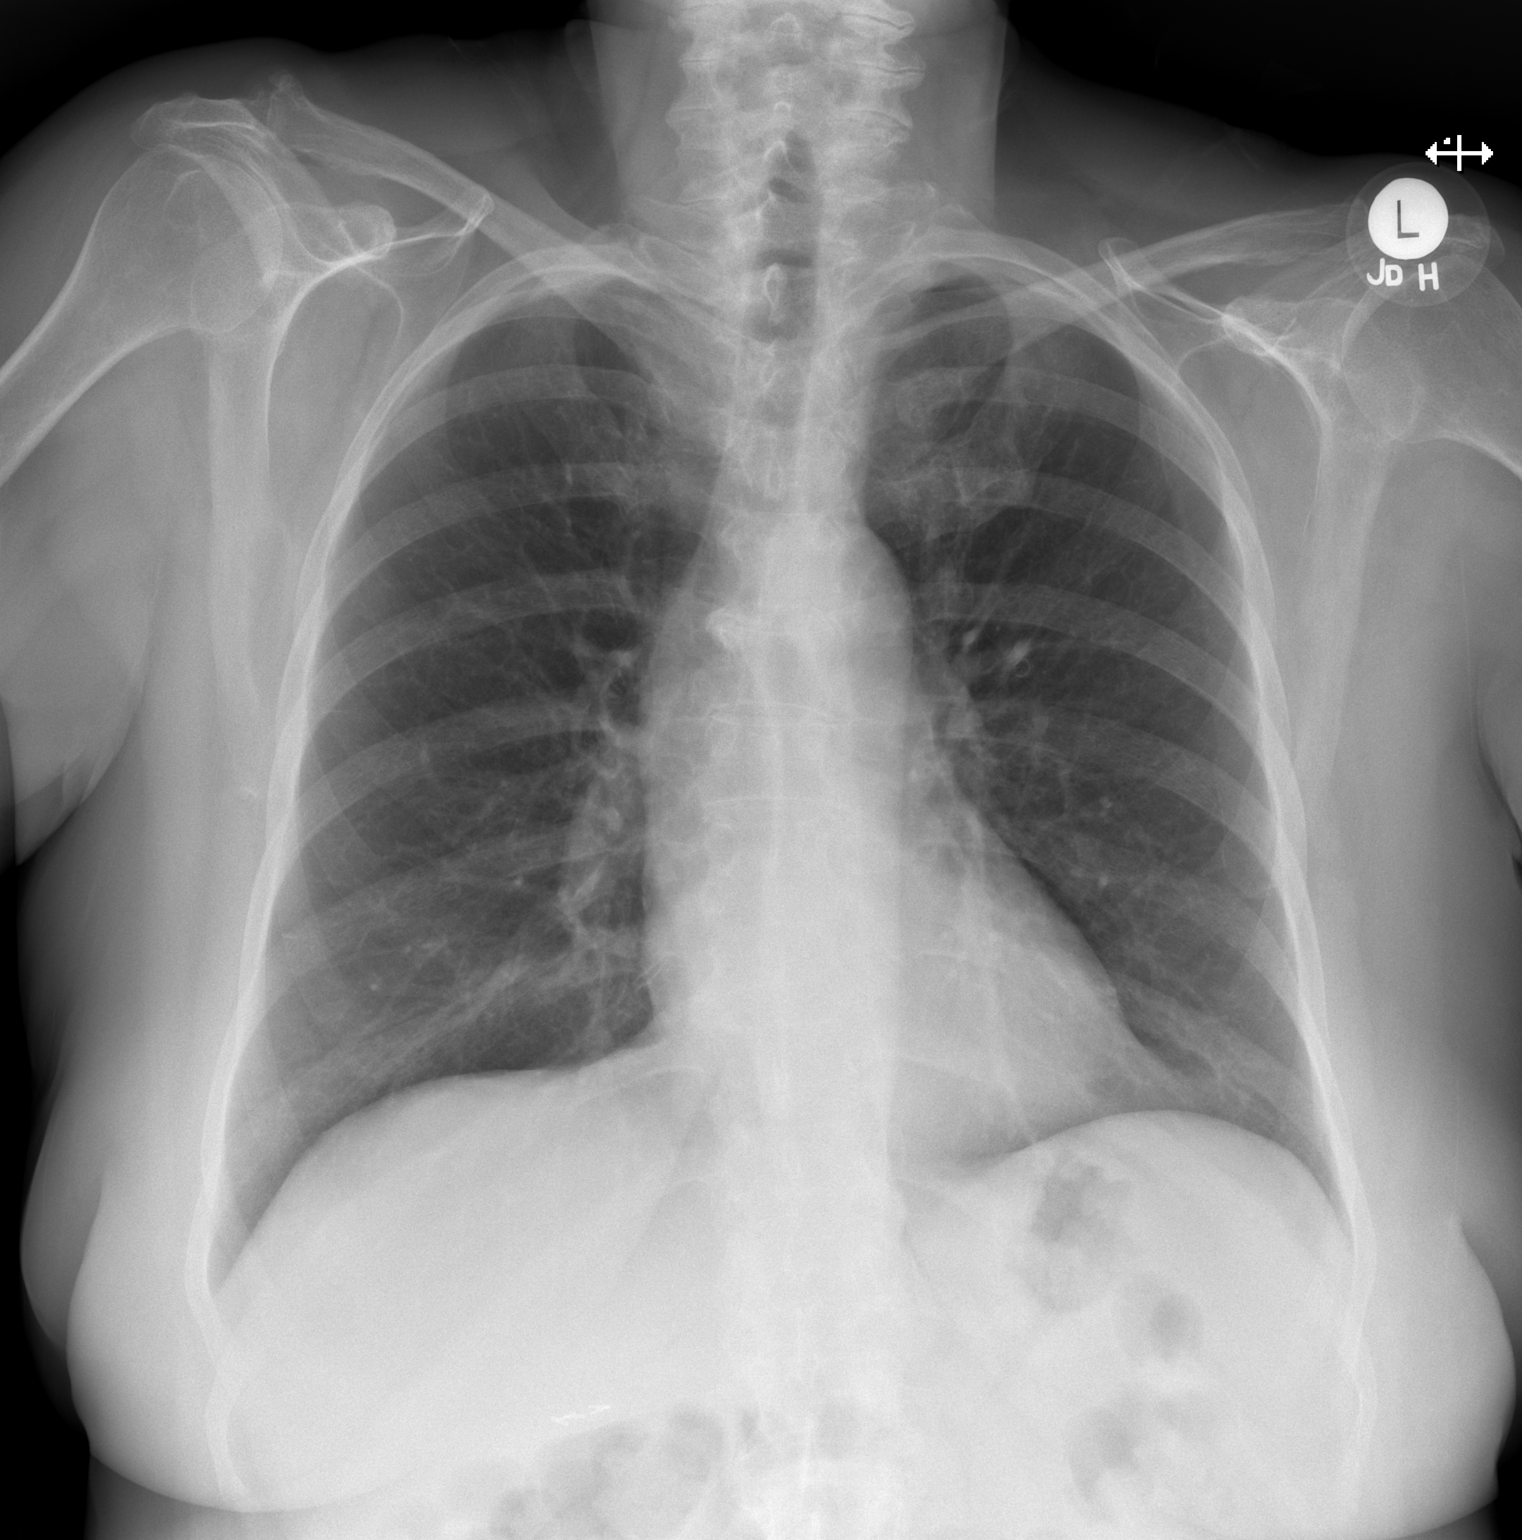

[w chest lat]
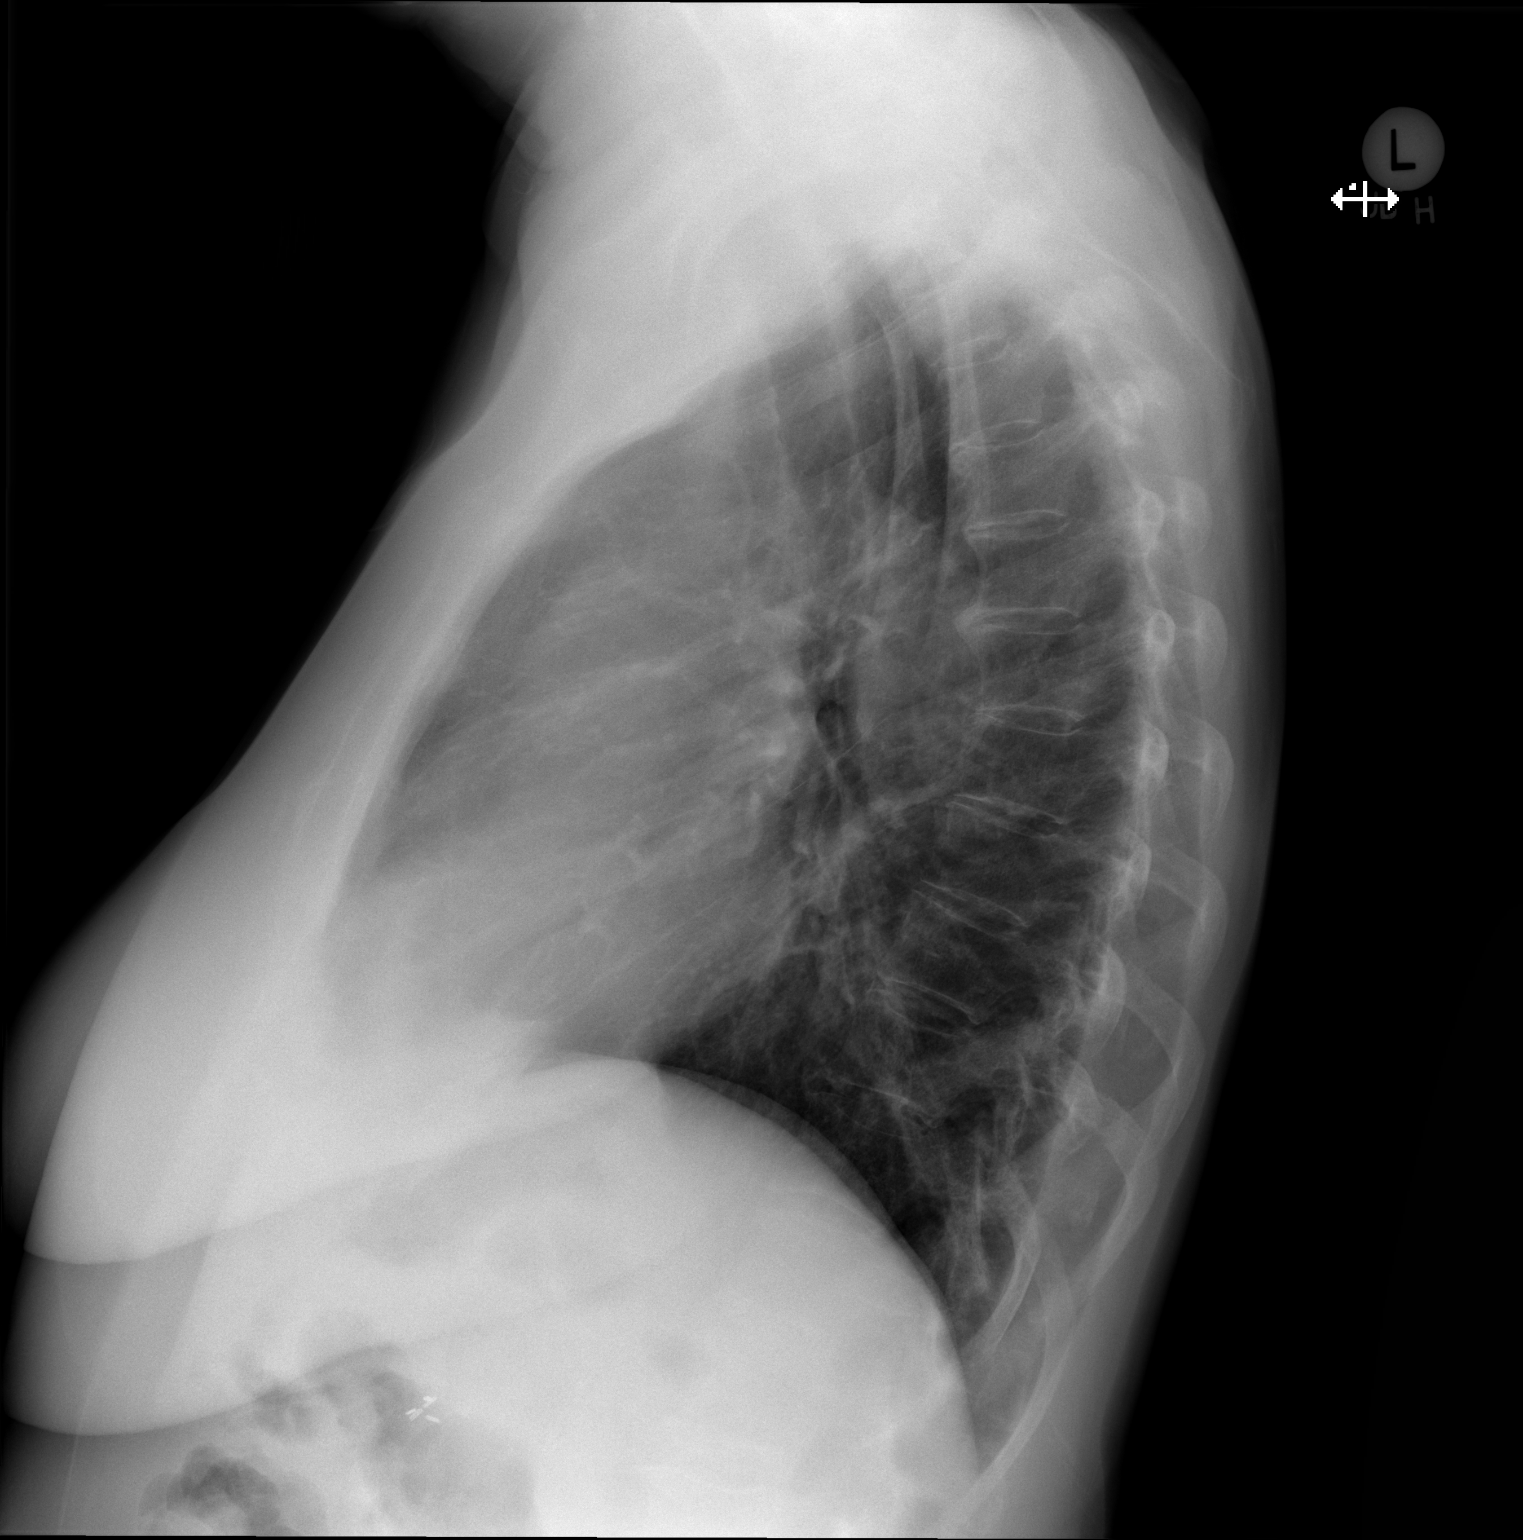

[2 of 2 positions shown; findings below may reference images not displayed]

FINDINGS: Mediastinum and hilar structures normal. Questionable nodular
opacities in the right lung base. This could represent small focal
area of atelectasis. To exclude persistent pulmonary nodule repeat
PA lateral chest x-ray suggested. This nodular density persists
chest CT can be obtained to further evaluate . No pleural effusion
pneumothorax. Heart size normal. Degenerative changes thoracic spine
.
IMPRESSION: Questionable nodule right lung base. This may represent a small
focal area of atelectasis and overlying shadows. Repeat PA and
lateral chest x-ray is suggested. Nodular density remains chest CT
can be obtained to further evaluate. No acute abnormality otherwise
noted .

## 2017-09-17 ENCOUNTER — Telehealth: Payer: Self-pay | Admitting: Internal Medicine

## 2017-09-17 NOTE — Telephone Encounter (Signed)
Also, can reach her at [phone].

## 2017-09-17 NOTE — Progress Notes (Signed)
@Patient  ID: Yolanda Hill, female    DOB: May 12, 1940, 77 y.o.   MRN: [id number]  Chief Complaint  Patient presents with  . Acute Visit    Day 4 of Augmentin, increased SOB and cough since last monday. Cough and chest discomfort, wheezing at night. Using anoro daily. Not using flutter valve.     Referring provider: Shirline Frees, MD  HPI: 77 yo female never smoker followed for chronic cough, chronic bronchitis, complicated by GERD   Recent [facility] Encounters:   10/30/16- 77 year old female never smoker followed for chronic cough, chronic bronchitis, complicated by GERD FOLLOWS FOR: Pt states she has had bad days since last visit; recent ER visit for viral bug as well. Cough-productive more from right side of chest/lung. Went to ENT last week and was told Acid reflux and caffiene is causing this cough-pt is weaning off caffiene-no changes in cough. Anoro, albuterol, omeprazole 40  She rarely feels any reflux, denies cough waking her at night and denies cough or choking while eating. She does try to avoid lying down after eating. Cough produces clear mucus. Tessalon didn't help. Using throat lozenges. Avoids going to church because cough is embarrassing.  03/08/17- 77 year old female never smoker followed for chronic cough, chronic bronchitis, complicated by GERD ---Chronic Bronchitis: Pt continues to have productive cough-slight yellow at times. Denies any wheezing or SOB with the cough. Pt has had trouble with losing her voice.  Anoro, Flutter ENT evaluated larynx and suggested it was reflux.  Chronic cough is better but not gone.  Sputum mostly white..  Sleeps on extra pillows now.   Tests:  Office Spirometry 05/01/2016-WNL. FVC 2.57/90%, FEV1 101/94%, ratio 0.78, FEF 25-75% 1.97/120%  Imaging:  03/09/2017-chest x-ray-no acute cardiopulmonary disease, chest is stable 11/07/2016-CT chest high-res- very subtle changes at the lung base, repeat CT recommended in 12  months, no bronchiectasis noted at this time, 4 mm right lower lobe pulmonary nodule seen  Cardiac:   Labs:  Allergy profile 03/17/2014-negative-total IgE only 3 with no specific elevations on the panel. CBC showed WBC 10,800, eosinophils 4%, PMN 69.3%   Micro:   Chart Review:  Expected to get CT high-resolution November 2019   09/18/17  Acute   Pleasant 77 year old patient today presenting to the office with cough, shortness of breath with exertion, occasionally feels febrile, and just feels like she is not improving on current treatments.  Patient was recently seen by her PCP and started on Augmentin as well as a prednisone taper.  Unfortunately it appears that the patient had not been taking her Augmentin correctly and was only taking 1 tablet a day versus the 2 times a day that it was prescribed.  Patient is also not been using her flutter valve  Patient also reporting that about 2 weeks ago she was out of her Anoro for about a week, but now has this refilled.  I will also refill today.  Patient reporting that her breathing has improved since restarting the Anoro.  Patient did attempt using her albuterol nebulizers at home and felt some relief but needs a prescription for this.  Patient been using over-the-counter cough medicine.   Allergies  Allergen Reactions  . Codeine Nausea Only  . Dymista [Azelastine-Fluticasone]     Nasal burning  . Simvastatin     Pt prefers not to take-personal reasonings    Immunization History  Administered Date(s) Administered  . Influenza Split 01/28/2008, 04/15/2009, 01/31/2010, 03/23/2011, 01/15/2014, 12/28/2014, 02/08/2015, 12/05/2016  . Influenza, High Dose  Seasonal PF 12/15/2011, 01/20/2013, 01/09/2016  . Pneumococcal Conjugate-13 08/14/2013  . Pneumococcal Polysaccharide-23 07/25/2006  . Tdap 06/21/2011  . Zoster 06/21/2011    Past Medical History:  Diagnosis Date  . Arthritis    osteoarthritis, cervical and lumbar  . Asthma     . Diverticulitis   . GERD (gastroesophageal reflux disease)   . Headache disorder 12/01/2014  . Headache(784.0)   . High blood pressure   . High cholesterol   . Insomnia   . Meniere disease   . Seasonal allergies     Tobacco History: Social History   Tobacco Use  Smoking Status Never Smoker  Smokeless Tobacco Never Used   Counseling given: Yes Continue not using  Outpatient Encounter Medications as of 09/18/2017  Medication Sig  . acetaminophen (TYLENOL) 500 MG tablet Take 1,000 mg by mouth daily as needed.  Marland Kitchen b complex vitamins tablet Take 1 tablet by mouth daily.  . Calcium Carbonate-Vitamin D (CALCIUM-VITAMIN D) 500-200 MG-UNIT per tablet Take 1 tablet by mouth daily.  . cholecalciferol (VITAMIN D) 1000 UNITS tablet Take 1,000 Units by mouth daily.  . fluticasone (FLONASE) 50 MCG/ACT nasal spray Place into both nostrils daily.  Marland Kitchen gabapentin (NEURONTIN) 300 MG capsule Take 1 capsule by mouth at bedtime.   Marland Kitchen ipratropium (ATROVENT) 0.06 % nasal spray Place 2 sprays into both nostrils 4 (four) times daily.  Marland Kitchen LORazepam (ATIVAN) 0.5 MG tablet Take 0.5 mg by mouth every 6 (six) hours as needed for anxiety.   . Multiple Vitamin (MULTIVITAMIN) capsule Take 1 capsule by mouth daily.  Marland Kitchen omeprazole (PRILOSEC) 40 MG capsule Take 40 mg by mouth at bedtime.  Marland Kitchen Respiratory Therapy Supplies (FLUTTER) DEVI Blow through 4 times per set, three sets daily  . telmisartan-hydrochlorothiazide (MICARDIS HCT) 40-12.5 MG tablet TK 1 T PO QD  . umeclidinium-vilanterol (ANORO ELLIPTA) 62.5-25 MCG/INH AEPB Inhale 1 puff into the lungs daily.  Marland Kitchen zolpidem (AMBIEN) 10 MG tablet Take 5-10 mg by mouth At bedtime as needed for sleep.   . [DISCONTINUED] losartan-hydrochlorothiazide (HYZAAR) 50-12.5 MG per tablet Take 1 tablet by mouth daily.  . [DISCONTINUED] umeclidinium-vilanterol (ANORO ELLIPTA) 62.5-25 MCG/INH AEPB Inhale 1 puff into the lungs daily.  Marland Kitchen albuterol (PROVENTIL) (2.5 MG/3ML) 0.083% nebulizer  solution Take 3 mLs (2.5 mg total) by nebulization every 6 (six) hours as needed for wheezing or shortness of breath.  Marland Kitchen amoxicillin-clavulanate (AUGMENTIN) 875-125 MG tablet Take 1 tablet by mouth 2 (two) times daily.  . benzonatate (TESSALON) 200 MG capsule Take 1 capsule (200 mg total) by mouth 3 (three) times daily as needed for cough.   No facility-administered encounter medications on file as of 09/18/2017.      Review of Systems  Constitutional: +fatigue, feeling febrile at times, chills  No  weight loss, night sweats HEENT:   No headaches,  Difficulty swallowing,  Tooth/dental problems, or  Sore throat, No sneezing, itching, ear ache, nasal congestion, post nasal drip  CV: No chest pain,  orthopnea, PND, swelling in lower extremities, anasarca, dizziness, palpitations, syncope  GI: No heartburn, indigestion, abdominal pain, nausea, vomiting, diarrhea, change in bowel habits, loss of appetite, bloody stools Resp: +wheezing, productive cough, sob with exertion and coughing,   No coughing up of blood.  No chest wall deformity Skin: no rash, lesions, no skin changes. GU: no dysuria, change in color of urine, no urgency or frequency.  No flank pain, no hematuria  MS:  No joint pain or swelling.  No decreased range of motion.  No back pain. Psych:  No change in mood or affect. No depression or anxiety.  No memory loss.   Physical Exam  BP (!) 142/70   Pulse 82   Temp 98.3 F (36.8 C) (Oral)   Ht 5\' 5"  (1.651 m)   Wt 173 lb (78.5 kg)   SpO2 93%   BMI 28.79 kg/m   Wt Readings from Last 3 Encounters:  09/18/17 173 lb (78.5 kg)  03/08/17 167 lb (75.8 kg)  10/30/16 174 lb 6.4 oz (79.1 kg)     GEN: A/Ox3; pleasant , NAD, well nourished    HEENT:  [place]/AT,  EACs-clear, TMs-wnl, NOSE-clear, THROAT-clear, no lesions, no postnasal drip or exudate noted.   NECK:  Supple w/ fair ROM; no JVD; normal carotid impulses w/o bruits; no thyromegaly or nodules palpated; no lymphadenopathy.     RESP:  + expiratory wheezes and rales in bases. Air movement in all lobes, deep breath illcits noisy wet/productive cough. Cough through out exam. no accessory muscle use, no dullness to percussion  CARD:  RRR, no m/r/g, no peripheral edema, pulses intact, no cyanosis or clubbing.  GI:   Soft & nt; nml bowel sounds; no organomegaly or masses detected.   Musco: Warm bil, no deformities or joint swelling noted.   Neuro: alert, no focal deficits noted.    Skin: Warm, no lesions or rashes    Lab Results:  CBC    Component Value Date/Time   WBC 14.8 (H) 09/26/2016 1322   RBC 4.70 09/26/2016 1322   HGB 15.0 09/26/2016 1322   HCT 43.7 09/26/2016 1322   PLT 289 09/26/2016 1322   MCV 93.0 09/26/2016 1322   MCH 31.9 09/26/2016 1322   MCHC 34.3 09/26/2016 1322   RDW 15.2 09/26/2016 1322   LYMPHSABS 1.4 09/26/2016 1322   MONOABS 0.8 09/26/2016 1322   EOSABS 0.1 09/26/2016 1322   BASOSABS 0.0 09/26/2016 1322    BMET    Component Value Date/Time   NA 132 (L) 09/26/2016 1322   K 3.8 09/26/2016 1322   CL 97 (L) 09/26/2016 1322   CO2 23 09/26/2016 1322   GLUCOSE 119 (H) 09/26/2016 1322   BUN 13 09/26/2016 1322   CREATININE 0.92 09/26/2016 1322   CALCIUM 9.1 09/26/2016 1322   GFRNONAA 59 (L) 09/26/2016 1322   GFRAA >60 09/26/2016 1322    BNP No results found for: BNP  ProBNP No results found for: PROBNP  Imaging: Dg Chest 2 View  Result Date: 09/18/2017 CLINICAL DATA:  Cough, congestion, wheezing EXAM: CHEST - 2 VIEW COMPARISON:  03/08/2017 FINDINGS: Heart and mediastinal contours are within normal limits. No focal opacities or effusions. No acute bony abnormality. IMPRESSION: No active cardiopulmonary disease. Electronically Signed   By: Rolm Baptise M.D.   On: 09/18/2017 11:12     Assessment & Plan:   Pleasant 77 year old patient seen in office today.  Patient with exacerbation of chronic bronchitis as well as persistent cough.  We will get a chest x-ray today,  extend her Augmentin dose 3 days for a total of 8 days, continue her prednisone taper as already prescribed from [facility], refilled Anoro, refilled albuterol nebulizer.   Encourage patient to use flutter valve at least 3-4 times a day to help with mucus clearance.  Emphasized importance of hydration as well as eating healthy nutritious meals.  Follow-up with Dr. Annamaria Boots in 2 months.  We will repeat CT as previously discussed in November/2019.   Chronic bronchitis (HCC) Exacerbation today  Extending  Augmentin dose by 3 more days for a total of 8 days Continue [facility]one [facility] Refilled Anoro inhaler Refilled albuterol nebulizer Chest x-ray today  Complete already scheduled CT in November 2019 as discussed  GERD (gastroesophageal reflux disease) Continue omeprazole at night  Cough in adult Continue prednisone We will add 3 days of Augmentin to initial prescription for a total of 8 days Continue using over-the-counter cough medicine Will start Tessalon Perles to help with cough and can use up to 3 a day 1 tablet every 8 hours      Lauraine Rinne, NP 09/18/2017

## 2017-09-17 NOTE — Telephone Encounter (Signed)
Spoke with the pt and notified of recs per CDY  OV with Aaron Edelman tomorrow at 10 am

## 2017-09-17 NOTE — Telephone Encounter (Signed)
Spoke with the pt  She states she has had increased cough and SOB for the past 1-2 wks  Seen by her PCP Dr Kenton Kingfisher on 6/7 and given Augmentin 875  She is coughing with yellow sputum  She states she is concerned that she has to put forth a lot of effort to breath  She feels as if she can not get a good breath in  Dr Kenton Kingfisher rec that she see CDY  Please advise thanks!  Allergies  Allergen Reactions  . Codeine Nausea Only  . Dymista [Azelastine-Fluticasone]     Nasal burning  . Simvastatin     Pt prefers not to take-personal reasonings   Current Outpatient Medications on File Prior to Visit  Medication Sig Dispense Refill  . acetaminophen (TYLENOL) 500 MG tablet Take 1,000 mg by mouth daily as needed.    Marland Kitchen b complex vitamins tablet Take 1 tablet by mouth daily.    . Calcium Carbonate-Vitamin D (CALCIUM-VITAMIN D) 500-200 MG-UNIT per tablet Take 1 tablet by mouth daily.    . cholecalciferol (VITAMIN D) 1000 UNITS tablet Take 1,000 Units by mouth daily.    . fluticasone (FLONASE) 50 MCG/ACT nasal spray Place into both nostrils daily.    Marland Kitchen gabapentin (NEURONTIN) 300 MG capsule Take 1 capsule by mouth at bedtime.   11  . ipratropium (ATROVENT) 0.06 % nasal spray Place 2 sprays into both nostrils 4 (four) times daily.    Marland Kitchen LORazepam (ATIVAN) 0.5 MG tablet Take 0.5 mg by mouth every 6 (six) hours as needed for anxiety.     Marland Kitchen losartan-hydrochlorothiazide (HYZAAR) 50-12.5 MG per tablet Take 1 tablet by mouth daily.    . Multiple Vitamin (MULTIVITAMIN) capsule Take 1 capsule by mouth daily.    Marland Kitchen omeprazole (PRILOSEC) 40 MG capsule Take 40 mg by mouth at bedtime.  1  . Respiratory Therapy Supplies (FLUTTER) DEVI Blow through 4 times per set, three sets daily 1 each 0  . umeclidinium-vilanterol (ANORO ELLIPTA) 62.5-25 MCG/INH AEPB Inhale 1 puff into the lungs daily. 2 each 0  . zolpidem (AMBIEN) 10 MG tablet Take 5-10 mg by mouth At bedtime as needed for sleep.      No current facility-administered  medications on file prior to visit.

## 2017-09-17 NOTE — Telephone Encounter (Signed)
If Im booked, ok to see NP.

## 2017-09-18 ENCOUNTER — Ambulatory Visit: Payer: Medicare Other | Admitting: Pulmonary Disease

## 2017-09-18 ENCOUNTER — Encounter: Payer: Self-pay | Admitting: Pulmonary Disease

## 2017-09-18 ENCOUNTER — Ambulatory Visit (INDEPENDENT_AMBULATORY_CARE_PROVIDER_SITE_OTHER)
Admission: RE | Admit: 2017-09-18 | Discharge: 2017-09-18 | Disposition: A | Discharge status: Home / Self Care | Payer: Medicare Other | Source: Ambulatory Visit | Attending: Pulmonary Disease | Admitting: Pulmonary Disease

## 2017-09-18 VITALS — BP 142/70 | HR 82 | Temp 98.3°F | Ht 65.0 in | Wt 164.5 lb

## 2017-09-18 DIAGNOSIS — K219 Gastro-esophageal reflux disease without esophagitis: Secondary | ICD-10-CM

## 2017-09-18 DIAGNOSIS — J410 Simple chronic bronchitis: Secondary | ICD-10-CM

## 2017-09-18 DIAGNOSIS — R059 Cough, unspecified: Secondary | ICD-10-CM

## 2017-09-18 DIAGNOSIS — R05 Cough: Secondary | ICD-10-CM

## 2017-09-18 MED ORDER — UMECLIDINIUM-VILANTEROL 62.5-25 MCG/INH IN AEPB: 1.0000 | INHALATION_SPRAY | Freq: Every day | RESPIRATORY_TRACT | 0 refills | Status: DC

## 2017-09-18 MED ORDER — AMOXICILLIN-POT CLAVULANATE 875-125 MG PO TABS: 1.0000 | ORAL_TABLET | Freq: Two times a day (BID) | ORAL | 0 refills | Status: DC

## 2017-09-18 MED ORDER — BENZONATATE 200 MG PO CAPS
200.0000 mg | ORAL_CAPSULE | Freq: Three times a day (TID) | ORAL | 1 refills | Status: DC | PRN
Start: 2017-09-18 — End: 2017-10-16

## 2017-09-18 MED ORDER — ALBUTEROL SULFATE (2.5 MG/3ML) 0.083% IN NEBU: 2.5000 mg | INHALATION_SOLUTION | Freq: Four times a day (QID) | RESPIRATORY_TRACT | 12 refills | Status: DC | PRN

## 2017-09-18 NOTE — Patient Instructions (Addendum)
Augmentin >>> 3 more days added (6 tablets) >>> Take 1 tablet by mouth every 12 hours, take with food >>> I encourage you to start yogurt for probiotic to stop with good Bacteria  Continue prednisone taper from your visit with Ritta Slot Ellipta   >>> refilled today  >>> Remember to call us if you are ever almost out of this medication, it is very important that we take this daily, if you are having difficulties affording it then please let us know  Albuteral nebulizer >>> refilled today  >>> use if needed for shortness of breath or  Wheezing  Chest x-ray today >>> We will call you with results  Flutter valve >>> Use 3-4 times a day >>> Can use 2 times a day once you are feeling better  Cough >>> Continue over-the-counter cough medicine >>> Tessalon Perles   CT follow-up >>> We will repeat this test in November 2019        Please contact the office if your symptoms worsen or you have concerns that you are not improving.   Thank you for choosing [facility] for your healthcare, and for allowing [place] to partner with you on your healthcare journey. I am thankful to be able to provide care to you today.   Wyn Quaker FNP-C

## 2017-09-18 NOTE — Progress Notes (Signed)
Chest x-ray results have come back.  This showing no acute changes.  No changes in plan of care from our discussion today.  If you are not improving with her measures and interventions that we discussed today then please follow-up with our office.  It was a pleasure taking care of you today.  Wyn Quaker FNP-C

## 2017-09-18 NOTE — Assessment & Plan Note (Addendum)
Exacerbation today  Extending Augmentin dose by 3 more days for a total of 8 days Continue [facility]one [facility] Refilled Anoro inhaler Refilled albuterol nebulizer Chest x-ray today  Complete already scheduled CT in November 2019 as discussed

## 2017-09-18 NOTE — Telephone Encounter (Signed)
ERROR

## 2017-09-18 NOTE — Assessment & Plan Note (Signed)
Continue prednisone We will add 3 days of Augmentin to initial prescription for a total of 8 days Continue using over-the-counter cough medicine Will start Tessalon Perles to help with cough and can use up to 3 a day 1 tablet every 8 hours

## 2017-09-18 NOTE — Assessment & Plan Note (Signed)
Continue omeprazole at night

## 2017-09-20 ENCOUNTER — Telehealth: Payer: Self-pay | Admitting: Pulmonary Disease

## 2017-09-20 NOTE — Telephone Encounter (Signed)
Called and spoke with patient, she is wanting to know how often throughout the day she should be using her flutter valve and how many times each time she uses it.   Per Brians last OV instructions patient is to use it 3-4 times a day. Advised patient of this. Patient verbalized understanding. Nothing further needed.

## 2017-10-16 ENCOUNTER — Ambulatory Visit (INDEPENDENT_AMBULATORY_CARE_PROVIDER_SITE_OTHER)
Admission: RE | Admit: 2017-10-16 | Discharge: 2017-10-16 | Disposition: A | Discharge status: Home / Self Care | Payer: Medicare Other | Source: Ambulatory Visit | Attending: Pulmonary Disease | Admitting: Pulmonary Disease

## 2017-10-16 ENCOUNTER — Ambulatory Visit: Payer: Medicare Other | Admitting: Pulmonary Disease

## 2017-10-16 ENCOUNTER — Encounter: Payer: Self-pay | Admitting: Pulmonary Disease

## 2017-10-16 DIAGNOSIS — K219 Gastro-esophageal reflux disease without esophagitis: Secondary | ICD-10-CM | POA: Diagnosis not present

## 2017-10-16 DIAGNOSIS — J410 Simple chronic bronchitis: Secondary | ICD-10-CM

## 2017-10-16 DIAGNOSIS — R059 Cough, unspecified: Secondary | ICD-10-CM

## 2017-10-16 DIAGNOSIS — J310 Chronic rhinitis: Secondary | ICD-10-CM | POA: Diagnosis not present

## 2017-10-16 DIAGNOSIS — R05 Cough: Secondary | ICD-10-CM | POA: Diagnosis not present

## 2017-10-16 MED ORDER — BENZONATATE 200 MG PO CAPS: 200.0000 mg | ORAL_CAPSULE | Freq: Three times a day (TID) | ORAL | 1 refills | Status: DC | PRN

## 2017-10-16 MED ORDER — UMECLIDINIUM-VILANTEROL 62.5-25 MCG/INH IN AEPB: 1.0000 | INHALATION_SPRAY | Freq: Every day | RESPIRATORY_TRACT | 0 refills | Status: DC

## 2017-10-16 MED ORDER — PREDNISONE 10 MG PO TABS
10.0000 mg | ORAL_TABLET | Freq: Every day | ORAL | 0 refills | Status: DC
Start: 2017-10-16 — End: 2017-11-19

## 2017-10-16 MED ORDER — DOXYCYCLINE HYCLATE 100 MG PO TABS: 100.0000 mg | ORAL_TABLET | Freq: Two times a day (BID) | ORAL | 0 refills | Status: DC

## 2017-10-16 MED ORDER — IPRATROPIUM BROMIDE 0.06 % NA SOLN: 2.0000 | Freq: Four times a day (QID) | NASAL | 5 refills | Status: DC

## 2017-10-16 NOTE — Progress Notes (Signed)
@Patient  ID: Yolanda Hill, female    DOB: 1940/05/30, 77 y.o.   MRN: [id number]  Chief Complaint  Patient presents with  . Acute Visit    Increased SOB, wheezing, productive cough, increased fatigue, and increased congestion. Finished nitrofurantin saturday for UTI.     Referring provider: Shirline Frees, MD  HPI:  77 yo female never smoker followed for chronic cough, chronic bronchitis, complicated by GERD   Recent [facility] Encounters:   10/30/16- 77 year old female never smoker followed for chronic cough, chronic bronchitis, complicated by GERD FOLLOWS FOR: Pt states she has had bad days since last visit; recent ER visit for viral bug as well. Cough-productive more from right side of chest/lung. Went to ENT last week and was told Acid reflux and caffiene is causing this cough-pt is weaning off caffiene-no changes in cough. Anoro, albuterol, omeprazole 40  She rarely feels any reflux, denies cough waking her at night and denies cough or choking while eating. She does try to avoid lying down after eating. Cough produces clear mucus. Tessalon didn't help. Using throat lozenges. Avoids going to church because cough is embarrassing.  03/08/17- 77 year old female never smoker followed for chronic cough, chronic bronchitis, complicated by GERD ---Chronic Bronchitis: Pt continues to have productive cough-slight yellow at times. Denies any wheezing or SOB with the cough. Pt has had trouble with losing her voice.  Anoro, Flutter ENT evaluated larynx and suggested it was reflux.  Chronic cough is better but not gone.  Sputum mostly white..  Sleeps on extra pillows now.  09/18/17  Acute  Pleasant 77 year old patient today presenting to the office with cough, shortness of breath with exertion, occasionally feels febrile, and just feels like she is not improving on current treatments.  Patient was recently seen by her PCP and started on Augmentin as well as a prednisone  taper. Unfortunately it appears that the patient had not been taking her Augmentin correctly and was only taking 1 tablet a day versus the 2 times a day that it was prescribed. Patient is also not been using her flutter valve Patient also reporting that about 2 weeks ago she was out of her Anoro for about a week, but now has this refilled.  I will also refill today.  Patient reporting that her breathing has improved since restarting the Anoro.  Patient did attempt using her albuterol nebulizers at home and felt some relief but needs a prescription for this. Patient been using over-the-counter cough medicine.    Tests:  Office Spirometry 05/01/2016-WNL. FVC 2.57/90%, FEV1 101/94%, ratio 0.78, FEF 25-75% 1.97/120%  Imaging:  03/09/2017-chest x-ray-no acute cardiopulmonary disease, chest is stable 11/07/2016-CT chest high-res- very subtle changes at the lung base, repeat CT recommended in 12 months, no bronchiectasis noted at this time, 4 mm right lower lobe pulmonary nodule seen  Cardiac:   Labs:  Allergy profile 03/17/2014-negative-total IgE only 3 with no specific elevations on the panel. CBC showed WBC 10,800, eosinophils 4%, PMN 69.3%   Micro:   Chart Review:  Expected to get CT high-resolution November 2019    10/16/17 Acute  77 year old patient seen office visit today.  Patient reports her symptoms have fairly improved since last office visit.  Patient feels that she still having productive cough, fatigue, afebrile.  Patient unsure why symptoms are not improving.   Allergies  Allergen Reactions  . Codeine Nausea Only  . Dymista [Azelastine-Fluticasone]     Nasal burning  . Simvastatin     Pt prefers not  to take-personal reasonings    Immunization History  Administered Date(s) Administered  . Influenza Split 01/28/2008, 04/15/2009, 01/31/2010, 03/23/2011, 01/15/2014, 12/28/2014, 02/08/2015, 12/05/2016  . Influenza, High Dose Seasonal PF 12/15/2011, 01/20/2013,  01/09/2016  . Pneumococcal Conjugate-13 08/14/2013  . Pneumococcal Polysaccharide-23 07/25/2006  . Tdap 06/21/2011  . Zoster 06/21/2011    Past Medical History:  Diagnosis Date  . Arthritis    osteoarthritis, cervical and lumbar  . Asthma   . Diverticulitis   . GERD (gastroesophageal reflux disease)   . Headache disorder 12/01/2014  . Headache(784.0)   . High blood pressure   . High cholesterol   . Insomnia   . Meniere disease   . Seasonal allergies     Tobacco History: Social History   Tobacco Use  Smoking Status Never Smoker  Smokeless Tobacco Never Used   Counseling given: Yes Continue not smoking.  Outpatient Encounter Medications as of 10/16/2017  Medication Sig  . acetaminophen (TYLENOL) 500 MG tablet Take 1,000 mg by mouth daily as needed.  Marland Kitchen albuterol (PROVENTIL) (2.5 MG/3ML) 0.083% nebulizer solution Take 3 mLs (2.5 mg total) by nebulization every 6 (six) hours as needed for wheezing or shortness of breath.  Marland Kitchen b complex vitamins tablet Take 1 tablet by mouth daily.  . benzonatate (TESSALON) 200 MG capsule Take 1 capsule (200 mg total) by mouth 3 (three) times daily as needed for cough.  . Calcium Carbonate-Vitamin D (CALCIUM-VITAMIN D) 500-200 MG-UNIT per tablet Take 1 tablet by mouth daily.  . cholecalciferol (VITAMIN D) 1000 UNITS tablet Take 1,000 Units by mouth daily.  . fluticasone (FLONASE) 50 MCG/ACT nasal spray Place into both nostrils daily.  Marland Kitchen gabapentin (NEURONTIN) 300 MG capsule Take 1 capsule by mouth at bedtime.   Marland Kitchen ipratropium (ATROVENT) 0.06 % nasal spray Place 2 sprays into both nostrils 4 (four) times daily.  Marland Kitchen LORazepam (ATIVAN) 0.5 MG tablet Take 0.5 mg by mouth every 6 (six) hours as needed for anxiety.   . Multiple Vitamin (MULTIVITAMIN) capsule Take 1 capsule by mouth daily.  Marland Kitchen omeprazole (PRILOSEC) 40 MG capsule Take 40 mg by mouth at bedtime.  Marland Kitchen Respiratory Therapy Supplies (FLUTTER) DEVI Blow through 4 times per set, three sets daily  .  telmisartan-hydrochlorothiazide (MICARDIS HCT) 40-12.5 MG tablet TK 1 T PO QD  . umeclidinium-vilanterol (ANORO ELLIPTA) 62.5-25 MCG/INH AEPB Inhale 1 puff into the lungs daily.  Marland Kitchen zolpidem (AMBIEN) 10 MG tablet Take 5-10 mg by mouth At bedtime as needed for sleep.   . [DISCONTINUED] amoxicillin-clavulanate (AUGMENTIN) 875-125 MG tablet Take 1 tablet by mouth 2 (two) times daily.  . [DISCONTINUED] benzonatate (TESSALON) 200 MG capsule Take 1 capsule (200 mg total) by mouth 3 (three) times daily as needed for cough.  . [DISCONTINUED] ipratropium (ATROVENT) 0.06 % nasal spray Place 2 sprays into both nostrils 4 (four) times daily.  . [DISCONTINUED] umeclidinium-vilanterol (ANORO ELLIPTA) 62.5-25 MCG/INH AEPB Inhale 1 puff into the lungs daily.  Marland Kitchen doxycycline (VIBRA-TABS) 100 MG tablet Take 1 tablet (100 mg total) by mouth 2 (two) times daily.  . predniSONE (DELTASONE) 10 MG tablet Take 1 tablet (10 mg total) by mouth daily with breakfast.  . umeclidinium-vilanterol (ANORO ELLIPTA) 62.5-25 MCG/INH AEPB Inhale 1 puff into the lungs daily.   No facility-administered encounter medications on file as of 10/16/2017.      Review of Systems  Review of Systems  Constitutional: Positive for activity change (poor activity ) and fatigue. Negative for fever.  HENT: Positive for congestion. Negative for postnasal  drip, sinus pressure, sinus pain and sore throat.   Respiratory: Positive for cough (productive white mucous ), shortness of breath and wheezing (improved this am ).   Cardiovascular: Negative for chest pain and palpitations.  Gastrointestinal: Negative for abdominal pain, blood in stool, constipation, diarrhea, nausea and vomiting.  Genitourinary: Positive for urgency. Negative for hematuria.       Stress incontinence  Musculoskeletal: Negative for arthralgias and back pain.  Skin: Negative for color change.  Allergic/Immunologic: Positive for environmental allergies.  Neurological: Positive for  dizziness (meneires syndrome), weakness and light-headedness. Negative for headaches.  Psychiatric/Behavioral: Negative for dysphoric mood. The patient is not nervous/anxious.   All other systems reviewed and are negative.    Physical Exam  BP 134/80   Pulse 76   Temp (!) 97 F (36.1 C) (Oral)   Ht 5\' 4"  (1.626 m)   Wt 168 lb 3.2 oz (76.3 kg)   SpO2 96%   BMI 28.87 kg/m   Wt Readings from Last 5 Encounters:  10/16/17 168 lb 3.2 oz (76.3 kg)  09/18/17 164 lb 8 oz (74.6 kg)  03/08/17 167 lb (75.8 kg)  10/30/16 174 lb 6.4 oz (79.1 kg)  05/01/16 174 lb 9.6 oz (79.2 kg)     Physical Exam  Constitutional: She is oriented to person, place, and time and well-developed, well-nourished, and in no distress. No distress.  HENT:  Head: Normocephalic and atraumatic.  Right Ear: External ear normal.  Left Ear: External ear normal.  Nose: Nose normal.  Mouth/Throat: Oropharynx is clear and moist. No oropharyngeal exudate.  Eyes: Pupils are equal, round, and reactive to light. Right eye exhibits no discharge. Left eye exhibits no discharge.  Neck: Normal range of motion. Neck supple. No thyromegaly present.  Cardiovascular: Normal rate, regular rhythm and normal heart sounds.  Pulmonary/Chest: Effort normal. No accessory muscle usage. No respiratory distress. She has no decreased breath sounds. She has wheezes (slight expiratory wheeze).  Few squeaks and bases  Abdominal: Soft. Bowel sounds are normal. She exhibits no distension. There is no tenderness.  Musculoskeletal: Normal range of motion. She exhibits no edema.  Lymphadenopathy:    She has no cervical adenopathy.  Neurological: She is alert and oriented to person, place, and time. Gait normal. GCS score is 15.  Skin: Skin is warm and dry. She is not diaphoretic. No erythema.  Psychiatric: Mood, memory, affect and judgment normal.  Nursing note and vitals reviewed.     Lab Results:  CBC    Component Value Date/Time   WBC  14.8 (H) 09/26/2016 1322   RBC 4.70 09/26/2016 1322   HGB 15.0 09/26/2016 1322   HCT 43.7 09/26/2016 1322   PLT 289 09/26/2016 1322   MCV 93.0 09/26/2016 1322   MCH 31.9 09/26/2016 1322   MCHC 34.3 09/26/2016 1322   RDW 15.2 09/26/2016 1322   LYMPHSABS 1.4 09/26/2016 1322   MONOABS 0.8 09/26/2016 1322   EOSABS 0.1 09/26/2016 1322   BASOSABS 0.0 09/26/2016 1322    BMET    Component Value Date/Time   NA 132 (L) 09/26/2016 1322   K 3.8 09/26/2016 1322   CL 97 (L) 09/26/2016 1322   CO2 23 09/26/2016 1322   GLUCOSE 119 (H) 09/26/2016 1322   BUN 13 09/26/2016 1322   CREATININE 0.92 09/26/2016 1322   CALCIUM 9.1 09/26/2016 1322   GFRNONAA 59 (L) 09/26/2016 1322   GFRAA >60 09/26/2016 1322    BNP No results found for: BNP  ProBNP No results  found for: PROBNP  Imaging: Dg Chest 2 View  Result Date: 10/16/2017 CLINICAL DATA:  Cough, congestion, dyspnea EXAM: CHEST - 2 VIEW COMPARISON:  09/18/2017 FINDINGS: The heart size and mediastinal contours are within normal limits. Both lungs are clear. The visualized skeletal structures are unremarkable. IMPRESSION: No active cardiopulmonary disease. Electronically Signed   By: Kathreen Devoid   On: 10/16/2017 12:14   Dg Chest 2 View  Result Date: 09/18/2017 CLINICAL DATA:  Cough, congestion, wheezing EXAM: CHEST - 2 VIEW COMPARISON:  03/08/2017 FINDINGS: Heart and mediastinal contours are within normal limits. No focal opacities or effusions. No acute bony abnormality. IMPRESSION: No active cardiopulmonary disease. Electronically Signed   By: Rolm Baptise M.D.   On: 09/18/2017 11:12     Assessment & Plan:   Pleasant 77 year old patient of Dr. Annamaria Boots seen in office today.  Patient with bronchitis flare.  Will treat with doxycycline and prednisone.  Will do chest x-ray.  Patient to continue with high-res CT in August as initially discussed.  Chronic bronchitis (HCC) Chest x-ray today  Doxycycline >>> 1 100 mg tablet every 12 hours for 7  days >>>take with food  >>>wear sunscreen   Prednisone 10mg  tablet >>> Take 2 tablets (20 mg total) daily for the next 5 days >>>Take with food   Consider starting daily antihistamine such as Zyrtec or Claritin can use generic over-the-counter  Refilled Atrovent nasal spray Refilled Tessalon Perles Refilled Anoro Ellipta >>> Sample of Anoro provided today   GERD (gastroesophageal reflux disease) Continue omeprazole  Rhinitis  Continue omeprazole  Consider starting daily antihistamine such as Zyrtec or Claritin can use generic over-the-counter  Refilled Atrovent nasal spray      Lauraine Rinne, NP 10/16/2017

## 2017-10-16 NOTE — Assessment & Plan Note (Signed)
Continue omeprazole 

## 2017-10-16 NOTE — Patient Instructions (Addendum)
Chest x-ray today  Doxycycline >>> 1 100 mg tablet every 12 hours for 7 days >>>take with food  >>>wear sunscreen   Prednisone 10mg  tablet >>> Take 2 tablets (20 mg total) daily for the next 5 days >>>Take with food  Continue omeprazole  Consider starting daily antihistamine such as Zyrtec or Claritin can use generic over-the-counter  Refilled Atrovent nasal spray Refilled Tessalon Perles Refilled Anoro Ellipta >>> Sample of Anoro provided today  Note your daily symptoms > remember "red flags" for COPD:   >>>Increase in cough >>>increase in sputum production >>>increase in shortness of breath or activity  intolerance.   If you notice these symptoms, please call the office to be seen.    Please contact the office if your symptoms worsen or you have concerns that you are not improving.   Thank you for choosing [facility] for your healthcare, and for allowing [place] to partner with you on your healthcare journey. I am thankful to be able to provide care to you today.   Wyn Quaker FNP-C

## 2017-10-16 NOTE — Assessment & Plan Note (Signed)
  Continue omeprazole  Consider starting daily antihistamine such as Zyrtec or Claritin can use generic over-the-counter  Refilled Atrovent nasal spray

## 2017-10-16 NOTE — Assessment & Plan Note (Signed)
Chest x-ray today  Doxycycline >>> 1 100 mg tablet every 12 hours for 7 days >>>take with food  >>>wear sunscreen   Prednisone 10mg  tablet >>> Take 2 tablets (20 mg total) daily for the next 5 days >>>Take with food   Consider starting daily antihistamine such as Zyrtec or Claritin can use generic over-the-counter  Refilled Atrovent nasal spray Refilled Tessalon Perles Refilled Anoro Ellipta >>> Sample of Anoro provided today

## 2017-11-19 ENCOUNTER — Encounter: Payer: Self-pay | Admitting: Internal Medicine

## 2017-11-19 ENCOUNTER — Ambulatory Visit: Payer: Medicare Other | Admitting: Internal Medicine

## 2017-11-19 VITALS — BP 132/80 | HR 73 | Ht 64.0 in | Wt 168.6 lb

## 2017-11-19 DIAGNOSIS — J410 Simple chronic bronchitis: Secondary | ICD-10-CM | POA: Diagnosis not present

## 2017-11-19 DIAGNOSIS — K219 Gastro-esophageal reflux disease without esophagitis: Secondary | ICD-10-CM | POA: Diagnosis not present

## 2017-11-19 DIAGNOSIS — R9389 Abnormal findings on diagnostic imaging of other specified body structures: Secondary | ICD-10-CM

## 2017-11-19 MED ORDER — MOMETASONE FURO-FORMOTEROL FUM 200-5 MCG/ACT IN AERO: 2.0000 | INHALATION_SPRAY | Freq: Two times a day (BID) | RESPIRATORY_TRACT | 0 refills | Status: DC

## 2017-11-19 NOTE — Assessment & Plan Note (Signed)
CT chest from 11/07/2016 did not see bronchiectasis so we are managing this is a chronic bronchitis.  Cannot exclude some tracheal malacia. Plan-try changing to [facility] sample for inhaled steroid, noting the Trelegy did not seem to help much.  Mucus does not suggest infection.

## 2017-11-19 NOTE — Assessment & Plan Note (Signed)
Pending follow-up high-resolution CT chest in November, 2019

## 2017-11-19 NOTE — Assessment & Plan Note (Signed)
Emphasized reflux precautions including elevation of head of bed.  Try not to sleep exclusively on right side.

## 2017-11-19 NOTE — Patient Instructions (Addendum)
Order- schedule PFT   Dx Chronic bronchitis  Order- lab- Environmental Allergy Profile     Dx chronic bronchits  (Cost information sheet given to patient)  Sample Dulera 200      Inhale 2 puffs, then rinse mouth well, twice daily   Try using up this sample, instead of Anoro. When the sample is used up, go back to Anoro for comparison.   We will check to verify you hav repeat chest CT scheduled in November.

## 2017-11-19 NOTE — Progress Notes (Signed)
HPI  female never smoker followed for chronic cough, chronic bronchitis, complicated by GERD Allergy profile 03/17/2014-negative-total IgE only 3 with no specific elevations on the panel. CBC showed WBC 10,800, eosinophils 4%, PMN 69.3%  ENT dr  told possibly GERD was told Acid reflux and caffiene is causing this cough Office Spirometry 05/01/2016-WNL. FVC 2.57/90%, FEV1 101/94%, ratio 0.78, FEF 25-75% 1.97/120% _____________________________________________-  03/08/17- 77 year old female never smoker followed for chronic cough, chronic bronchitis, complicated by GERD ---Chronic Bronchitis: Pt continues to have productive cough-slight yellow at times. Denies any wheezing or SOB with the cough. Pt has had trouble with losing her voice.  Anoro, Flutter ENT evaluated larynx and suggested it was reflux.  Chronic cough is better but not gone.  Sputum mostly white..  Sleeps on extra pillows now. We reviewed chest CT and discussed minimal interstitial markings. CT chest 11/07/16 IMPRESSION: 1. There are very subtle changes in the lung bases, which could be indicative of early interstitial lung disease such as nonspecific interstitial pneumonia (NSIP). Repeat high-resolution chest CT is suggested in 12 months to assess for temporal changes in the appearance of the lung parenchyma. 2. No bronchiectasis noted at this time. 3. 4 mm right lower lobe pulmonary nodule (axial image 86 of series 3). No follow-up needed if patient is low-risk. Non-contrast chest CT can be considered in 12 months if patient is high-risk. This recommendation follows the consensus statement: Guidelines for Management of Incidental Pulmonary Nodules Detected on CT Images: From the Fleischner Society 2017; Radiology 2017; 284:228-243. 4. Aortic atherosclerosis, in addition to 2 vessel coronary artery disease. Assessment for potential risk factor modification, dietary therapy or pharmacologic therapy may be warranted, if  clinically indicated. Aortic Atherosclerosis (ICD10-I70.0).  11/19/2017-  77 year old female never smoker followed for chronic cough, chronic bronchitis, complicated by GERD Pending CT HR for 1 yr f/u of small lung nodule and ? NSIP, sched for 11/ 2019 Was seen earlier this summer for exacerbation of bronchitis when she had dropped off of Anoro, and was not using Augmentin prescribed by PCP correctly.  She was treated here in July with doxycycline and prednisone.  Continued on omeprazole for GERD. Bronchitis: Pt has slight SOB at times with cough but not nearly as bad as before-productive at times-yellow in color.  Flutter device, neb albuterol, and Anoro, Flonase, Atrovent 0.06% nasal spray Still some cough but much better than during exacerbation earlier this summer.  More aware of rattle from her right chest and tends to sleep right side down.  Flutter device helps some.  Mucinex and Trelegy inhaler or little help.  Using nebulizer with albuterol once daily without much effect.  Previous CT did not see bronchiectasis so she is not a candidate for a pneumatic vest. She demonstrates deep rattling cough productive of scant clear mucus.  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 CXR 10/16/2017- IMPRESSION: No active cardiopulmonary disease.  ROS-see HPI   + = positive Constitutional:   No-   weight loss, night sweats, fevers, chills, fatigue, lassitude. HEENT:   No-  headaches, difficulty swallowing, tooth/dental problems, sore throat,       No-  sneezing, itching, ear ache,                               +nasal congestion, post nasal drip,  CV:  No-   chest pain, orthopnea, PND, swelling in lower extremities, anasarca,  dizziness, palpitations Resp: No-   shortness of breath with exertion or at rest.              + productive cough,   + cough,  No- coughing up of blood.              No-   change in color of mucus.  No- wheezing.   Skin: No-    rash or lesions. GI:  No-   heartburn, indigestion, abdominal pain, nausea, vomiting,  GU: . MS:   Neuro-     nothing unusual Psych:  No- change in mood or affect. No depression or anxiety.  No memory loss.  OBJ- Physical Exam General- Alert, Oriented, Affect-appropriate, Distress- none acute Skin- rash-none, lesions- none, excoriation- none Lymphadenopathy- none Head- atraumatic            Eyes- Gross vision intact, PERRLA, conjunctivae and secretions clear            Ears- Hearing, canals-normal            Nose- Clear, no-Septal dev, mucus, polyps, erosion, perforation             Throat- Mallampati III , mucosa clear , drainage- none, tonsils- atrophic, hoarse + Neck- flexible , trachea midline, no stridor , thyroid nl, carotid no bruit Chest - symmetrical excursion , unlabored           Heart/CV- RRR , no murmur , no gallop  , no rub, nl s1 s2                           - JVD- none , edema- none, stasis changes- none, varices- none           Lung-+coarser on R, wheeze- none, cough+ deep rattling, , dullness-none, rub- none           Chest wall-  Abd-  Br/ Gen/ Rectal- Not done, not indicated Extrem- cyanosis- none, clubbing, none, atrophy- none, strength- nl Neuro- grossly intact to observation

## 2017-11-21 ENCOUNTER — Telehealth: Payer: Self-pay | Admitting: Internal Medicine

## 2017-11-21 NOTE — Telephone Encounter (Signed)
Called and spoke with pt regarding her blood work that is requested by [facility] Pt advised she will come to the labs by 11/30/2017 Pt thought the lab test was needed at different location other than [place],  advised pt she can have them in our labs on floor B anytime from 8am-5pm Nothing further needed.

## 2017-11-26 ENCOUNTER — Telehealth: Payer: Self-pay | Admitting: Internal Medicine

## 2017-11-26 NOTE — Telephone Encounter (Signed)
[place] and replace on med list with [employer]

## 2017-11-26 NOTE — Addendum Note (Signed)
Addended by: Parke Poisson E on: 11/26/2017 12:42 PM   Modules accepted: Orders

## 2017-11-26 NOTE — Telephone Encounter (Signed)
Called and spoke with patient regarding having a issue with taking delera 27mcg Pt reports that when taking delera 200 mcg she was not feeling right, and symptoms worsen Pt states that she only took the delera 210mcg for 3 days and began old tx plan Pt is currently back to taking anoro in the morning, and nebulizer tx and mucinix at bedtime Pt would like to know if [facility] is okay with this current treatment plan and aware she is not taking delera 262mcg  [facility] please advise   Allergies  Allergen Reactions  . Codeine Nausea Only  . Dymista [Azelastine-Fluticasone]     Nasal burning  . Simvastatin     Pt prefers not to take-personal reasonings   Current Outpatient Medications on File Prior to Visit  Medication Sig Dispense Refill  . acetaminophen (TYLENOL) 500 MG tablet Take 1,000 mg by mouth daily as needed.    Marland Kitchen albuterol (PROVENTIL) (2.5 MG/3ML) 0.083% nebulizer solution Take 3 mLs (2.5 mg total) by nebulization every 6 (six) hours as needed for wheezing or shortness of breath. 75 mL 12  . b complex vitamins tablet Take 1 tablet by mouth daily.    . benzonatate (TESSALON) 200 MG capsule Take 1 capsule (200 mg total) by mouth 3 (three) times daily as needed for cough. 30 capsule 1  . Calcium Carbonate-Vitamin D (CALCIUM-VITAMIN D) 500-200 MG-UNIT per tablet Take 1 tablet by mouth daily.    . cholecalciferol (VITAMIN D) 1000 UNITS tablet Take 1,000 Units by mouth daily.    Marland Kitchen doxycycline (VIBRA-TABS) 100 MG tablet Take 1 tablet (100 mg total) by mouth 2 (two) times daily. 14 tablet 0  . fluticasone (FLONASE) 50 MCG/ACT nasal spray Place into both nostrils daily.    Marland Kitchen gabapentin (NEURONTIN) 300 MG capsule Take 1 capsule by mouth at bedtime.   11  . ipratropium (ATROVENT) 0.06 % nasal spray Place 2 sprays into both nostrils 4 (four) times daily. 15 mL 5  . LORazepam (ATIVAN) 0.5 MG tablet Take 0.5 mg by mouth every 6 (six) hours as needed for anxiety.     . mometasone-formoterol (DULERA) 200-5  MCG/ACT AERO Inhale 2 puffs into the lungs 2 (two) times daily. 1 Inhaler 0  . Multiple Vitamin (MULTIVITAMIN) capsule Take 1 capsule by mouth daily.    Marland Kitchen omeprazole (PRILOSEC) 40 MG capsule Take 40 mg by mouth at bedtime.  1  . Respiratory Therapy Supplies (FLUTTER) DEVI Blow through 4 times per set, three sets daily 1 each 0  . telmisartan-hydrochlorothiazide (MICARDIS HCT) 40-12.5 MG tablet TK 1 T PO QD  5  . umeclidinium-vilanterol (ANORO ELLIPTA) 62.5-25 MCG/INH AEPB Inhale 1 puff into the lungs daily. 1 each 0  . zolpidem (AMBIEN) 10 MG tablet Take 5-10 mg by mouth At bedtime as needed for sleep.      No current facility-administered medications on file prior to visit.

## 2017-11-26 NOTE — Telephone Encounter (Signed)
Med list updated; patient is aware Nothing further needed; will sign off

## 2017-11-27 ENCOUNTER — Other Ambulatory Visit: Payer: Medicare Other

## 2017-11-27 DIAGNOSIS — J410 Simple chronic bronchitis: Secondary | ICD-10-CM

## 2017-11-28 LAB — RESPIRATORY ALLERGY PROFILE REGION II ~~LOC~~
Allergen, Comm Silver Birch, t9: <0.1 kU/L
Allergen, Cottonwood, t14: <0.1 kU/L
Bermuda Grass: <0.1 kU/L
Box Elder IgE: <0.1 kU/L
CLADOSPORIUM HERBARUM (M2) IGE: <0.1 kU/L
CLASS: 0
CLASS: 0
CLASS: 0
CLASS: 0
CLASS: 0
CLASS: 0
CLASS: 0
CLASS: 0
CLASS: 0
COMMON RAGWEED (SHORT) (W1) IGE: <0.1 kU/L
Class: 0
Class: 0
Class: 0
Class: 0
Class: 0
Class: 0
Class: 0
Class: 0
Class: 0
Class: 0
Class: 0
Class: 0
Class: 0
Class: 0
Class: 0
Cockroach: <0.1 kU/L
Dog Dander: <0.1 kU/L
Pecan/Hickory Tree IgE: <0.1 kU/L
Rough Pigweed  IgE: <0.1 kU/L
Sheep Sorrel IgE: <0.1 kU/L

## 2017-11-28 LAB — INTERPRETATION:

## 2017-12-05 ENCOUNTER — Telehealth: Payer: Self-pay | Admitting: Internal Medicine

## 2017-12-05 NOTE — Telephone Encounter (Signed)
Called and spoke with Patient results given.  Understanding stated.  Nothing further at this time.

## 2017-12-13 ENCOUNTER — Encounter: Payer: Self-pay | Admitting: Gastroenterology

## 2018-01-04 ENCOUNTER — Telehealth: Payer: Self-pay | Admitting: Internal Medicine

## 2018-01-04 NOTE — Telephone Encounter (Signed)
Of course fine for her to see whomever she would like for second opinion/ transfer of care.

## 2018-01-04 NOTE — Telephone Encounter (Signed)
Dr. Annamaria Boots, Sharl Ma stated the patient would like a second opinion with another one doctor in our practice. She was diagnosed with simple chronic bronchitis. Can she make an appt with another provider and if so which one do you recommend. Please advise.    Patient Instructions by Deneise Lever, MD at 11/19/2017 11:30 AM  Author: Deneise Lever, MD Author Type: Physician Filed: 11/19/2017 12:08 PM  Note Status: Addendum Cosign: Cosign Not Required Encounter Date: 11/19/2017  Editor: Lorane Gell, [place] (Certified Medical Assistant)  Prior Versions: 1. Deneise Lever, MD (Physician) at 11/19/2017 11:59 AM - Addendum   2. Deneise Lever, MD (Physician) at 11/19/2017 11:56 AM - Signed    Order- schedule PFT   Dx Chronic bronchitis  Order- lab- Environmental Allergy Profile     Dx chronic bronchits  (Cost information sheet given to patient)  Sample Dulera 200      Inhale 2 puffs, then rinse mouth well, twice daily   Try using up this sample, instead of Anoro. When the sample is used up, go back to Anoro for comparison.   We will check to verify you hav repeat chest CT scheduled in November.      Current Outpatient Medications on File Prior to Visit  Medication Sig Dispense Refill  . acetaminophen (TYLENOL) 500 MG tablet Take 1,000 mg by mouth daily as needed.    Marland Kitchen albuterol (PROVENTIL) (2.5 MG/3ML) 0.083% nebulizer solution Take 3 mLs (2.5 mg total) by nebulization every 6 (six) hours as needed for wheezing or shortness of breath. 75 mL 12  . b complex vitamins tablet Take 1 tablet by mouth daily.    . benzonatate (TESSALON) 200 MG capsule Take 1 capsule (200 mg total) by mouth 3 (three) times daily as needed for cough. 30 capsule 1  . Calcium Carbonate-Vitamin D (CALCIUM-VITAMIN D) 500-200 MG-UNIT per tablet Take 1 tablet by mouth daily.    . cholecalciferol (VITAMIN D) 1000 UNITS tablet Take 1,000 Units by mouth daily.    Marland Kitchen doxycycline (VIBRA-TABS) 100 MG tablet Take 1 tablet (100 mg  total) by mouth 2 (two) times daily. 14 tablet 0  . fluticasone (FLONASE) 50 MCG/ACT nasal spray Place into both nostrils daily.    Marland Kitchen gabapentin (NEURONTIN) 300 MG capsule Take 1 capsule by mouth at bedtime.   11  . ipratropium (ATROVENT) 0.06 % nasal spray Place 2 sprays into both nostrils 4 (four) times daily. 15 mL 5  . LORazepam (ATIVAN) 0.5 MG tablet Take 0.5 mg by mouth every 6 (six) hours as needed for anxiety.     . Multiple Vitamin (MULTIVITAMIN) capsule Take 1 capsule by mouth daily.    Marland Kitchen omeprazole (PRILOSEC) 40 MG capsule Take 40 mg by mouth at bedtime.  1  . Respiratory Therapy Supplies (FLUTTER) DEVI Blow through 4 times per set, three sets daily 1 each 0  . telmisartan-hydrochlorothiazide (MICARDIS HCT) 40-12.5 MG tablet TK 1 T PO QD  5  . umeclidinium-vilanterol (ANORO ELLIPTA) 62.5-25 MCG/INH AEPB Inhale 1 puff into the lungs daily. 1 each 0  . zolpidem (AMBIEN) 10 MG tablet Take 5-10 mg by mouth At bedtime as needed for sleep.      No current facility-administered medications on file prior to visit.    Allergies  Allergen Reactions  . Codeine Nausea Only  . Dymista [Azelastine-Fluticasone]     Nasal burning  . Simvastatin     Pt prefers not to take-personal reasonings

## 2018-01-04 NOTE — Telephone Encounter (Signed)
Ok will route to [employer].

## 2018-01-07 NOTE — Telephone Encounter (Signed)
Called and scheduled patient with Dr. Valeta Harms on 01/16/2018 -pr

## 2018-01-09 ENCOUNTER — Telehealth: Payer: Self-pay | Admitting: Internal Medicine

## 2018-01-09 MED ORDER — UMECLIDINIUM-VILANTEROL 62.5-25 MCG/INH IN AEPB: 1.0000 | INHALATION_SPRAY | Freq: Every day | RESPIRATORY_TRACT | 0 refills | Status: AC

## 2018-01-09 NOTE — Telephone Encounter (Addendum)
Called and spoke to pt.  Pt is requesting samples of anoro, as she is currently in the donut hole.  I have made pt that one sample of anoro and patient assistant forms have been placed up front for pick up. Nothing further is needed.

## 2018-01-15 NOTE — Progress Notes (Deleted)
Synopsis: Referred in *** for *** by Shirline Frees, MD  Subjective:   PATIENT ID: Yolanda Hill GENDER: female DOB: 07/26/1940, MRN: [id number]  No chief complaint on file.   HPI  ***  Past Medical History:  Diagnosis Date  . Abdominal aortic atherosclerosis ([place])   . Allergic rhinitis   . Anxiety   . Arthritis    osteoarthritis, cervical and lumbar  . Asthma   . Diverticulitis   . Eczema   . GERD (gastroesophageal reflux disease)   . Headache disorder 12/01/2014  . Headache(784.0)   . High blood pressure   . High cholesterol   . Hypothyroid   . Idiopathic peripheral neuropathy   . Insomnia   . Interstitial lung disease ([place])   . Meniere disease   . Osteoarthritis   . Osteopenia   . Seasonal allergies   . Vitamin D deficiency      Family History  Problem Relation Age of Onset  . Alcohol abuse Father   . Kidney disease Father   . Heart disease Mother   . Diabetes Sister   . Stroke Sister   . Depression Sister   . Diabetes Sister   . Hypertension Sister   . Emphysema Paternal Grandfather   . COPD Paternal Grandfather   . Heart attack Paternal Grandmother   . Testicular cancer Grandchild   . Colon cancer Neg Hx   . Stomach cancer Neg Hx   . Esophageal cancer Neg Hx      Past Surgical History:  Procedure Laterality Date  . ABDOMINAL HYSTERECTOMY  1971   partial  . APPENDECTOMY  1971   during hysterectomy  . BRAVO [place] STUDY  09/29/2011   Procedure: BRAVO [place] STUDY;  Surgeon: Inda Castle, MD;  Location: [facility];  Service: Endoscopy;  Laterality: N/A;  . CHOLECYSTECTOMY     1989  . ESOPHAGOGASTRODUODENOSCOPY  09/29/2011   Procedure: ESOPHAGOGASTRODUODENOSCOPY (EGD);  Surgeon: Inda Castle, MD;  Location: [facility];  Service: Endoscopy;  Laterality: N/A;  pt. to stay on ppi  . GALLBLADDER SURGERY  1989  . PARTIAL HYSTERECTOMY  1971    Social History   Socioeconomic History  . Marital status: Widowed    Spouse name: Not on file    . Number of children: 5  . Years of education: 64  . Highest education level: Not on file  Occupational History  . Occupation: retired  Scientific laboratory technician  . Financial resource strain: Not on file  . Food insecurity:    Worry: Not on file    Inability: Not on file  . Transportation needs:    Medical: Not on file    Non-medical: Not on file  Tobacco Use  . Smoking status: Never Smoker  . Smokeless tobacco: Never Used  Substance and Sexual Activity  . Alcohol use: Yes    Comment: rarely  . Drug use: No  . Sexual activity: Not on file  Lifestyle  . Physical activity:    Days per week: Not on file    Minutes per session: Not on file  . Stress: Not on file  Relationships  . Social connections:    Talks on phone: Not on file    Gets together: Not on file    Attends religious service: Not on file    Active member of club or organization: Not on file    Attends meetings of clubs or organizations: Not on file    Relationship status: Not on file  .  Intimate partner violence:    Fear of current or ex partner: Not on file    Emotionally abused: Not on file    Physically abused: Not on file    Forced sexual activity: Not on file  Other Topics Concern  . Not on file  Social History Narrative   Patient drinks caffeine occasionally.   Patient is right handed.     Allergies  Allergen Reactions  . Codeine Nausea Only  . Dymista [Azelastine-Fluticasone]     Nasal burning  . Simvastatin     Pt prefers not to take-personal reasonings     Outpatient Medications Prior to Visit  Medication Sig Dispense Refill  . acetaminophen (TYLENOL) 500 MG tablet Take 1,000 mg by mouth daily as needed.    Marland Kitchen albuterol (PROVENTIL) (2.5 MG/3ML) 0.083% nebulizer solution Take 3 mLs (2.5 mg total) by nebulization every 6 (six) hours as needed for wheezing or shortness of breath. 75 mL 12  . b complex vitamins tablet Take 1 tablet by mouth daily.    . benzonatate (TESSALON) 200 MG capsule Take 1 capsule  (200 mg total) by mouth 3 (three) times daily as needed for cough. 30 capsule 1  . Calcium Carbonate-Vitamin D (CALCIUM-VITAMIN D) 500-200 MG-UNIT per tablet Take 1 tablet by mouth daily.    . cholecalciferol (VITAMIN D) 1000 UNITS tablet Take 1,000 Units by mouth daily.    Marland Kitchen doxycycline (VIBRA-TABS) 100 MG tablet Take 1 tablet (100 mg total) by mouth 2 (two) times daily. 14 tablet 0  . fluticasone (FLONASE) 50 MCG/ACT nasal spray Place into both nostrils daily.    Marland Kitchen gabapentin (NEURONTIN) 300 MG capsule Take 1 capsule by mouth at bedtime.   11  . ipratropium (ATROVENT) 0.06 % nasal spray Place 2 sprays into both nostrils 4 (four) times daily. 15 mL 5  . LORazepam (ATIVAN) 0.5 MG tablet Take 0.5 mg by mouth every 6 (six) hours as needed for anxiety.     . Multiple Vitamin (MULTIVITAMIN) capsule Take 1 capsule by mouth daily.    Marland Kitchen omeprazole (PRILOSEC) 40 MG capsule Take 40 mg by mouth at bedtime.  1  . Respiratory Therapy Supplies (FLUTTER) DEVI Blow through 4 times per set, three sets daily 1 each 0  . telmisartan-hydrochlorothiazide (MICARDIS HCT) 40-12.5 MG tablet TK 1 T PO QD  5  . umeclidinium-vilanterol (ANORO ELLIPTA) 62.5-25 MCG/INH AEPB Inhale 1 puff into the lungs daily. 1 each 0  . zolpidem (AMBIEN) 10 MG tablet Take 5-10 mg by mouth At bedtime as needed for sleep.      No facility-administered medications prior to visit.     ROS   Objective:  Physical Exam   There were no vitals filed for this visit.   on *** LPM *** RA BMI Readings from Last 3 Encounters:  11/19/17 28.94 kg/m  10/16/17 28.87 kg/m  09/18/17 27.37 kg/m   Wt Readings from Last 3 Encounters:  11/19/17 168 lb 9.6 oz (76.5 kg)  10/16/17 168 lb 3.2 oz (76.3 kg)  09/18/17 164 lb 8 oz (74.6 kg)     CBC    Component Value Date/Time   WBC 14.8 (H) 09/26/2016 1322   RBC 4.70 09/26/2016 1322   HGB 15.0 09/26/2016 1322   HCT 43.7 09/26/2016 1322   PLT 289 09/26/2016 1322   MCV 93.0 09/26/2016 1322    MCH 31.9 09/26/2016 1322   MCHC 34.3 09/26/2016 1322   RDW 15.2 09/26/2016 1322   LYMPHSABS 1.4 09/26/2016 1322  MONOABS 0.8 09/26/2016 1322   EOSABS 0.1 09/26/2016 1322   BASOSABS 0.0 09/26/2016 1322    ***  Chest Imaging: ***  Pulmonary Functions Testing Results:  FeNO: ***  Pathology: ***  Echocardiogram: ***  Heart Catheterization: ***    Assessment & Plan:   No diagnosis found.  Discussion: ***   Current Outpatient Medications:  .  acetaminophen (TYLENOL) 500 MG tablet, Take 1,000 mg by mouth daily as needed., Disp: , Rfl:  .  albuterol (PROVENTIL) (2.5 MG/3ML) 0.083% nebulizer solution, Take 3 mLs (2.5 mg total) by nebulization every 6 (six) hours as needed for wheezing or shortness of breath., Disp: 75 mL, Rfl: 12 .  b complex vitamins tablet, Take 1 tablet by mouth daily., Disp: , Rfl:  .  benzonatate (TESSALON) 200 MG capsule, Take 1 capsule (200 mg total) by mouth 3 (three) times daily as needed for cough., Disp: 30 capsule, Rfl: 1 .  Calcium Carbonate-Vitamin D (CALCIUM-VITAMIN D) 500-200 MG-UNIT per tablet, Take 1 tablet by mouth daily., Disp: , Rfl:  .  cholecalciferol (VITAMIN D) 1000 UNITS tablet, Take 1,000 Units by mouth daily., Disp: , Rfl:  .  doxycycline (VIBRA-TABS) 100 MG tablet, Take 1 tablet (100 mg total) by mouth 2 (two) times daily., Disp: 14 tablet, Rfl: 0 .  fluticasone (FLONASE) 50 MCG/ACT nasal spray, Place into both nostrils daily., Disp: , Rfl:  .  gabapentin (NEURONTIN) 300 MG capsule, Take 1 capsule by mouth at bedtime. , Disp: , Rfl: 11 .  ipratropium (ATROVENT) 0.06 % nasal spray, Place 2 sprays into both nostrils 4 (four) times daily., Disp: 15 mL, Rfl: 5 .  LORazepam (ATIVAN) 0.5 MG tablet, Take 0.5 mg by mouth every 6 (six) hours as needed for anxiety. , Disp: , Rfl:  .  Multiple Vitamin (MULTIVITAMIN) capsule, Take 1 capsule by mouth daily., Disp: , Rfl:  .  omeprazole (PRILOSEC) 40 MG capsule, Take 40 mg by mouth at bedtime.,  Disp: , Rfl: 1 .  Respiratory Therapy Supplies (FLUTTER) DEVI, Blow through 4 times per set, three sets daily, Disp: 1 each, Rfl: 0 .  telmisartan-hydrochlorothiazide (MICARDIS HCT) 40-12.5 MG tablet, TK 1 T PO QD, Disp: , Rfl: 5 .  umeclidinium-vilanterol (ANORO ELLIPTA) 62.5-25 MCG/INH AEPB, Inhale 1 puff into the lungs daily., Disp: 1 each, Rfl: 0 .  zolpidem (AMBIEN) 10 MG tablet, Take 5-10 mg by mouth At bedtime as needed for sleep. , Disp: , Rfl:    Garner Nash, DO [facility] 01/15/2018 10:32 PM

## 2018-01-16 ENCOUNTER — Ambulatory Visit: Payer: Medicare Other | Admitting: Pulmonary Disease

## 2018-01-24 ENCOUNTER — Telehealth: Payer: Self-pay | Admitting: Internal Medicine

## 2018-01-24 MED ORDER — UMECLIDINIUM-VILANTEROL 62.5-25 MCG/INH IN AEPB: 1.0000 | INHALATION_SPRAY | Freq: Every day | RESPIRATORY_TRACT | 0 refills | Status: DC

## 2018-01-24 NOTE — Telephone Encounter (Signed)
Called and spoke with pt who was requesting a sample of anoro.  I stated to pt that we did have a sample that I would place up front for her.  Pt expressed understanding.nothing further needed.

## 2018-01-29 ENCOUNTER — Encounter: Payer: Self-pay | Admitting: Gastroenterology

## 2018-01-29 ENCOUNTER — Encounter (INDEPENDENT_AMBULATORY_CARE_PROVIDER_SITE_OTHER): Payer: Self-pay

## 2018-01-29 ENCOUNTER — Ambulatory Visit: Payer: Medicare Other | Admitting: Gastroenterology

## 2018-01-29 VITALS — BP 146/68 | HR 86 | Ht 64.0 in | Wt 168.1 lb

## 2018-01-29 DIAGNOSIS — R9389 Abnormal findings on diagnostic imaging of other specified body structures: Secondary | ICD-10-CM

## 2018-01-29 DIAGNOSIS — Z86010 Personal history of colonic polyps: Secondary | ICD-10-CM

## 2018-01-29 MED ORDER — PEG-KCL-NACL-NASULF-NA ASC-C 140 G PO SOLR: 140.0000 g | ORAL | 0 refills | Status: DC

## 2018-01-29 NOTE — Patient Instructions (Signed)
If you are age 78 or older, your body mass index should be between 23-30. Your Body mass index is 28.86 kg/m. If this is out of the aforementioned range listed, please consider follow up with your Primary Care Provider.  If you are age 57 or younger, your body mass index should be between 19-25. Your Body mass index is 28.86 kg/m. If this is out of the aformentioned range listed, please consider follow up with your Primary Care Provider.   You have been scheduled for a colonoscopy. Please follow written instructions given to you at your visit today.  Please pick up your prep supplies at the pharmacy within the next 1-3 days. If you use inhalers (even only as needed), please bring them with you on the day of your procedure. Your physician has requested that you go to www.startemmi.com and enter the access code given to you at your visit today. This web site gives a general overview about your procedure. However, you should still follow specific instructions given to you by our office regarding your preparation for the procedure.  It was a pleasure to see you today!  Dr. Loletha Carrow

## 2018-01-29 NOTE — Progress Notes (Addendum)
[facility] Consult Note:  History: Yolanda Hill 01/29/2018  Referring physician: Shirline Frees, MD  Reason for consult/chief complaint: Discuss Colon (CT Scan done 2 weeks ago at [facility] ) and Abdominal Pain (LRQ )   Subjective  HPI:  Referral from primary care was for colon cancer screening.  Patient underwent colonoscopy with Dr. Deatra Ina December 2013 for notable for subcentimeter rectal tubular adenoma as well as left colon diverticulosis and severe spasm ,making scope passage difficult. [name] reports that she recently had hematuria and was evaluated by urology.  She was having some intermittent vague right sided pain at that time as well.  A CT scan was reportedly done, and she was told there might be "some infection", and she was advised to review that with [place] today.  She is not currently having any abdominal pain.  She denies rectal bleeding or altered bowel habits.   ROS:  Review of Systems  Constitutional: Positive for fatigue. Negative for appetite change and unexpected weight change.  HENT: Negative for mouth sores and voice change.   Eyes: Negative for pain and redness.  Respiratory: Negative for cough and shortness of breath.   Cardiovascular: Negative for chest pain and palpitations.  Genitourinary: Negative for dysuria and hematuria.  Musculoskeletal: Positive for arthralgias. Negative for myalgias.  Skin: Negative for pallor and rash.  Neurological: Negative for weakness and headaches.  Hematological: Negative for adenopathy.     Past Medical History: Past Medical History:  Diagnosis Date  . Abdominal aortic atherosclerosis ([place])   . Allergic rhinitis   . Anxiety   . Arthritis    osteoarthritis, cervical and lumbar  . Asthma   . Diverticulitis   . Eczema   . GERD (gastroesophageal reflux disease)   . Headache disorder 12/01/2014  . Headache(784.0)   . High blood pressure   . High cholesterol   . Hypothyroid     . Idiopathic peripheral neuropathy   . Insomnia   . Interstitial lung disease ([place])   . Meniere disease   . Osteoarthritis   . Osteopenia   . Seasonal allergies   . Vitamin D deficiency      Past Surgical History: Past Surgical History:  Procedure Laterality Date  . ABDOMINAL HYSTERECTOMY  1971   partial  . APPENDECTOMY  1971   during hysterectomy  . BRAVO [place] STUDY  09/29/2011   Procedure: BRAVO [place] STUDY;  Surgeon: Inda Castle, MD;  Location: [facility];  Service: Endoscopy;  Laterality: N/A;  . CATARACT EXTRACTION    . CHOLECYSTECTOMY     1989  . ESOPHAGOGASTRODUODENOSCOPY  09/29/2011   Procedure: ESOPHAGOGASTRODUODENOSCOPY (EGD);  Surgeon: Inda Castle, MD;  Location: [facility];  Service: Endoscopy;  Laterality: N/A;  pt. to stay on ppi  . GALLBLADDER SURGERY  1989  . PARTIAL HYSTERECTOMY  1971     Family History: Family History  Problem Relation Age of Onset  . Alcohol abuse Father   . Kidney disease Father   . Heart disease Mother   . Diabetes Sister   . Stroke Sister   . Depression Sister   . Diabetes Sister   . Hypertension Sister   . Emphysema Paternal Grandfather   . COPD Paternal Grandfather   . Heart attack Paternal Grandmother   . Testicular cancer Grandchild   . Colon cancer Neg Hx   . Stomach cancer Neg Hx   . Esophageal cancer Neg Hx     Social History: Social History  Socioeconomic History  . Marital status: Widowed    Spouse name: Not on file  . Number of children: 5  . Years of education: 37  . Highest education level: Not on file  Occupational History  . Occupation: retired  Scientific laboratory technician  . Financial resource strain: Not on file  . Food insecurity:    Worry: Not on file    Inability: Not on file  . Transportation needs:    Medical: Not on file    Non-medical: Not on file  Tobacco Use  . Smoking status: Never Smoker  . Smokeless tobacco: Never Used  Substance and Sexual Activity  . Alcohol use: Yes    Comment:  rarely  . Drug use: No  . Sexual activity: Not on file  Lifestyle  . Physical activity:    Days per week: Not on file    Minutes per session: Not on file  . Stress: Not on file  Relationships  . Social connections:    Talks on phone: Not on file    Gets together: Not on file    Attends religious service: Not on file    Active member of club or organization: Not on file    Attends meetings of clubs or organizations: Not on file    Relationship status: Not on file  Other Topics Concern  . Not on file  Social History Narrative   Patient drinks caffeine occasionally.   Patient is right handed.    Allergies: Allergies  Allergen Reactions  . Codeine Nausea Only  . Dymista [Azelastine-Fluticasone]     Nasal burning  . Simvastatin     Pt prefers not to take-personal reasonings    Outpatient Meds: Current Outpatient Medications  Medication Sig Dispense Refill  . acetaminophen (TYLENOL) 500 MG tablet Take 1,000 mg by mouth daily as needed.    Marland Kitchen albuterol (PROVENTIL) (2.5 MG/3ML) 0.083% nebulizer solution Take 3 mLs (2.5 mg total) by nebulization every 6 (six) hours as needed for wheezing or shortness of breath. 75 mL 12  . b complex vitamins tablet Take 1 tablet by mouth daily.    . benzonatate (TESSALON) 200 MG capsule Take 1 capsule (200 mg total) by mouth 3 (three) times daily as needed for cough. 30 capsule 1  . Calcium Carbonate-Vitamin D (CALCIUM-VITAMIN D) 500-200 MG-UNIT per tablet Take 1 tablet by mouth daily.    . cholecalciferol (VITAMIN D) 1000 UNITS tablet Take 1,000 Units by mouth daily.    . fluticasone (FLONASE) 50 MCG/ACT nasal spray Place into both nostrils daily.    Marland Kitchen gabapentin (NEURONTIN) 300 MG capsule Take 1 capsule by mouth at bedtime.   11  . ipratropium (ATROVENT) 0.06 % nasal spray Place 2 sprays into both nostrils 4 (four) times daily. 15 mL 5  . LORazepam (ATIVAN) 0.5 MG tablet Take 0.5 mg by mouth every 6 (six) hours as needed for anxiety.     .  Multiple Vitamin (MULTIVITAMIN) capsule Take 1 capsule by mouth daily.    Marland Kitchen omeprazole (PRILOSEC) 40 MG capsule Take 40 mg by mouth at bedtime.  1  . Respiratory Therapy Supplies (FLUTTER) DEVI Blow through 4 times per set, three sets daily 1 each 0  . telmisartan-hydrochlorothiazide (MICARDIS HCT) 40-12.5 MG tablet TK 1 T PO QD  5  . umeclidinium-vilanterol (ANORO ELLIPTA) 62.5-25 MCG/INH AEPB Inhale 1 puff into the lungs daily. 7 each 0  . zolpidem (AMBIEN) 10 MG tablet Take 5-10 mg by mouth At bedtime as needed for  sleep.     Marland Kitchen PEG-KCl-NaCl-NaSulf-Na Asc-C (PLENVU) 140 g SOLR Take 140 g by mouth as directed. 1 each 0   No current facility-administered medications for this visit.       ___________________________________________________________________ Objective   Exam:  BP (!) 146/68   Pulse 86   Ht 5\' 4"  (1.626 m)   Wt 168 lb 2 oz (76.3 kg)   BMI 28.86 kg/m    General: this is a(n) well-appearing woman  Eyes: sclera anicteric, no redness  ENT: oral mucosa moist without lesions, no cervical or supraclavicular lymphadenopathy, good dentition  CV: RRR without murmur, S1/S2, no JVD, no peripheral edema  Resp: clear to auscultation bilaterally, normal RR and effort noted  GI: soft, no tenderness, with active bowel sounds. No guarding or palpable organomegaly noted.  Skin; warm and dry, no rash or jaundice noted  Neuro: awake, alert and oriented x 3. Normal gross motor function and fluent speech  Labs:  Chronically low sodium at 131, chloride 95, potassium 4.0, bicarb 29, BUN 7, creatinine 0.78 TSH elevated at 12.3, but free T4 normal at 0.62 Last CBC 06/14/2017 normal  Radiologic Studies:  Because this CT urogram was done through [facility] urology, I am not able to get the report through the epic system.  I was able to pull up the images and personally reviewed them.  The only potential abnormality I notice or what may have been cause for concern was thickening of the  sigmoid.  However, there is severe diverticulosis in that area and there is no oral contrast. We have made a request for the formal CT report.  Assessment: Encounter Diagnoses  Name Primary?  . Personal history of colonic polyps Yes  . Abnormal CT scan     She is due for surveillance colonoscopy.  The concern on CT is uncertain at this point.  If it was something related to the sigmoid colon, it is probably an artifact of imaging due to diverticulosis and lack of oral contrast.  Naturally, the entire colon will be examined during the upcoming procedure.  She is agreeable after discussion of procedure and risks.  The benefits and risks of the planned procedure were described in detail with the patient or (when appropriate) their health care proxy.  Risks were outlined as including, but not limited to, bleeding, infection, perforation, adverse medication reaction leading to cardiac or pulmonary decompensation, or pancreatitis (if ERCP).  The limitation of incomplete mucosal visualization was also discussed.  No guarantees or warranties were given.   Thank you for the courtesy of this consult.  Please call me with any questions or concerns.  Nelida Meuse III  CC: Shirline Frees, MD   Addendum 02/06/2018  Records from Dr. Ellison Hughs of urology.  CT abdomen and pelvis with and without contrast dated 01/16/2018:  Normal liver, status post cholecystectomy, no biliary ductal dilatation. No suspicious renal masses  Gastric under distention, extensive colonic diverticulosis.  Pericolonic edema identified into the sigmoid on one image. 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 Loletha Carrow, MD

## 2018-02-08 ENCOUNTER — Telehealth: Payer: Self-pay | Admitting: Gastroenterology

## 2018-02-08 NOTE — Telephone Encounter (Signed)
Pt will call close to procedure date for a free sample kit.

## 2018-02-22 ENCOUNTER — Encounter: Payer: Medicare Other | Admitting: Gastroenterology

## 2018-02-26 ENCOUNTER — Telehealth: Payer: Self-pay | Admitting: Internal Medicine

## 2018-02-26 NOTE — Telephone Encounter (Signed)
Patient has been given samples and forms. Nothing further needed.

## 2018-02-26 NOTE — Telephone Encounter (Signed)
Called and spoke with patient, advised that we have placed a sample up front. Also advised patient that we have placed a new packet of patient assistance forms with the sample since the patient misplaced hers. Nothing further needed.

## 2018-03-08 ENCOUNTER — Ambulatory Visit: Payer: Medicare Other | Admitting: Internal Medicine

## 2018-03-14 ENCOUNTER — Ambulatory Visit (INDEPENDENT_AMBULATORY_CARE_PROVIDER_SITE_OTHER)
Admission: RE | Admit: 2018-03-14 | Discharge: 2018-03-14 | Disposition: A | Discharge status: Home / Self Care | Payer: Medicare Other | Source: Ambulatory Visit | Attending: Internal Medicine | Admitting: Internal Medicine

## 2018-03-14 DIAGNOSIS — J849 Interstitial pulmonary disease, unspecified: Secondary | ICD-10-CM

## 2018-03-21 ENCOUNTER — Ambulatory Visit: Payer: Medicare Other | Admitting: Internal Medicine

## 2018-03-21 ENCOUNTER — Encounter: Payer: Self-pay | Admitting: Internal Medicine

## 2018-03-21 ENCOUNTER — Ambulatory Visit (INDEPENDENT_AMBULATORY_CARE_PROVIDER_SITE_OTHER): Payer: Medicare Other | Admitting: Internal Medicine

## 2018-03-21 VITALS — BP 110/70 | HR 90 | Ht 65.0 in | Wt 166.0 lb

## 2018-03-21 DIAGNOSIS — J410 Simple chronic bronchitis: Secondary | ICD-10-CM

## 2018-03-21 DIAGNOSIS — R9389 Abnormal findings on diagnostic imaging of other specified body structures: Secondary | ICD-10-CM | POA: Diagnosis not present

## 2018-03-21 DIAGNOSIS — J418 Mixed simple and mucopurulent chronic bronchitis: Secondary | ICD-10-CM

## 2018-03-21 DIAGNOSIS — J849 Interstitial pulmonary disease, unspecified: Secondary | ICD-10-CM | POA: Diagnosis not present

## 2018-03-21 LAB — PULMONARY FUNCTION TEST
DL/VA % PRED: 97 %
DL/VA: 4.82 ml/min/mmHg/L
DLCO UNC % PRED: 86 %
DLCO UNC: 22.17 ml/min/mmHg
FEF 25-75 POST: 1.49 L/s
FEF 25-75 Pre: 1.28 L/sec
FEF2575-%CHANGE-POST: 16 %
FEF2575-%PRED-POST: 92 %
FEF2575-%PRED-PRE: 79 %
FEV1-%CHANGE-POST: 3 %
FEV1-%Pred-Post: 88 %
FEV1-%Pred-Pre: 85 %
FEV1-PRE: 1.84 L
FEV1-Post: 1.9 L
FEV1FVC-%Change-Post: 2 %
FEV1FVC-%Pred-Pre: 96 %
FEV6-%Change-Post: 1 %
FEV6-%PRED-PRE: 93 %
FEV6-%Pred-Post: 94 %
FEV6-POST: 2.55 L
FEV6-Pre: 2.52 L
FEV6FVC-%Change-Post: 0 %
FEV6FVC-%Pred-Post: 104 %
FEV6FVC-%Pred-Pre: 104 %
FVC-%Change-Post: 1 %
FVC-%PRED-POST: 90 %
FVC-%Pred-Pre: 89 %
FVC-Post: 2.58 L
FVC-Pre: 2.55 L
POST FEV1/FVC RATIO: 74 %
PRE FEV1/FVC RATIO: 72 %
Post FEV6/FVC ratio: 99 %
Pre FEV6/FVC Ratio: 99 %
RV % pred: 99 %
RV: 2.37 L
TLC % PRED: 96 %
TLC: 5.01 L

## 2018-03-21 NOTE — Progress Notes (Signed)
PFT done today. 

## 2018-03-21 NOTE — Progress Notes (Signed)
HPI  female never smoker followed for chronic cough, chronic bronchitis, complicated by GERD, ectatic aorta Allergy profile 03/17/2014-negative-total IgE only 3 with no specific elevations on the panel. CBC showed WBC 10,800, eosinophils 4%, PMN 69.3%  ENT dr  told possibly GERD was told Acid reflux and caffiene is causing this cough Office Spirometry 05/01/2016-WNL. FVC 2.57/90%, FEV1 101/94%, ratio 0.78, FEF 25-75% 1.97/120% _____________________________________________-  11/19/2017-  77 year old female never smoker followed for chronic cough, chronic bronchitis, complicated by GERD Pending CT HR for 1 yr f/u of small lung nodule and ? NSIP, sched for 11/ 2019 Was seen earlier this summer for exacerbation of bronchitis when she had dropped off of Anoro, and was not using Augmentin prescribed by PCP correctly.  She was treated here in July with doxycycline and prednisone.  Continued on omeprazole for GERD. Bronchitis: Pt has slight SOB at times with cough but not nearly as bad as before-productive at times-yellow in color.  Flutter device, neb albuterol, and Anoro, Flonase, Atrovent 0.06% nasal spray Still some cough but much better than during exacerbation earlier this summer.  More aware of rattle from her right chest and tends to sleep right side down.  Flutter device helps some.  Mucinex and Trelegy inhaler or little help.  Using nebulizer with albuterol once daily without much effect.  Previous CT did not see bronchiectasis so she is not a candidate for a pneumatic vest. She demonstrates deep rattling cough productive of scant clear mucus.  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 CXR 10/16/2017- IMPRESSION: No active cardiopulmonary disease.  03/21/2018- 77 year old female never smoker followed for chronic cough, chronic bronchitis, complicated by GERD Anoro, neb albuterol, Atrovent 0.06% nasal spray, -----Chronic Bronchitis: Review PFT with patient today.  Cough has been much better  as she has been careful with reflux precautions and has used Anoro. PFT 03/21/18-WNL or minimal obstruction.  Normal diffusion.  FVC 2.58/90%, FEV1 1.90/88%, ratio 0.74, TLC 96%, DLCO 86% CT chest Hi Res 03/14/2018- 1. Patchy debris throughout the right lower lobe airways, presumably mucoid material. Aspiration is a consideration. No acute consolidative airspace disease to suggest a pneumonia. 2. Mild patchy subpleural reticulation and ground-glass attenuation in the dependent lung bases, right greater than left, mildly increased in the interval. No bronchiectasis or honeycombing. Findings remain indeterminate, potentially representing postinfectious/postinflammatory scarring or hypoventilatory changes, particularly given the airway findings above. The possibility of a mild interstitial lung disease such as nonspecific interstitial pneumonia (NSIP) or early usual interstitial pneumonia (UIP) cannot be excluded. Follow-up high-resolution chest CT study in 12 months may be obtained as clinically warranted. 3. 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 Recommend annual imaging followup by CTA or MRA. This recommendation follows 2010 ACCF/AHA/AATS/ACR/ASA/SCA/SCAI/SIR/STS/SVM Guidelines for the Diagnosis and Management of Patients with Thoracic Aortic Disease. Circulation. 2010; 121: D924-Q683. 4. Two-vessel coronary atherosclerosis.  ROS-see HPI   + = positive Constitutional:   No-   weight loss, night sweats, fevers, chills, fatigue, lassitude. HEENT:   No-  headaches, difficulty swallowing, tooth/dental problems, sore throat,       No-  sneezing, itching, ear ache,                               +nasal congestion, post nasal drip,  CV:  No-   chest pain, orthopnea, PND, swelling in lower extremities, anasarca,  dizziness, palpitations Resp: No-   shortness of breath with exertion or at rest.              + productive cough,   + cough,  No-  coughing up of blood.              No-   change in color of mucus.  No- wheezing.   Skin: No-   rash or lesions. GI:  No-   heartburn, indigestion, abdominal pain, nausea, vomiting,  GU: . MS:   Neuro-     nothing unusual Psych:  No- change in mood or affect. No depression or anxiety.  No memory loss.  OBJ- Physical Exam General- Alert, Oriented, Affect-appropriate, Distress- none acute Skin- rash-none, lesions- none, excoriation- none Lymphadenopathy- none Head- atraumatic            Eyes- Gross vision intact, PERRLA, conjunctivae and secretions clear            Ears- Hearing, canals-normal            Nose- Clear, no-Septal dev, mucus, polyps, erosion, perforation             Throat- Mallampati III , mucosa clear , drainage- none, tonsils- atrophic, hoarse + Neck- flexible , trachea midline, no stridor , thyroid nl, carotid no bruit Chest - symmetrical excursion , unlabored           Heart/CV- RRR , no murmur , no gallop  , no rub, nl s1 s2                           - JVD- none , edema- none, stasis changes- none, varices- none           Lung-, wheeze- none, cough- none , dullness-none, rub- none           Chest wall-  Abd-  Br/ Gen/ Rectal- Not done, not indicated Extrem- cyanosis- none, clubbing, none, atrophy- none, strength- nl Neuro- grossly intact to observation

## 2018-03-21 NOTE — Patient Instructions (Signed)
We can continue present meds  Please continue to be careful to minimize risk of reflux- avoid eating close to bedtime and avoid caffeine  Order- schedule CT chest Hi Res future in 1 year    Dx ILD  Please cal if we can help

## 2018-03-22 NOTE — Assessment & Plan Note (Signed)
I think we are mostly seeing the effects of chronic bronchitis with intermittent aspiration.  We will watch over time for any progression of interstitial disease.

## 2018-03-22 NOTE — Assessment & Plan Note (Signed)
Clinical context has made chronic aspiration a likely explanation for her cough.  Fortunately this is improved. Plan-continue Anoro inhaler, continue reflux precautions

## 2018-03-27 ENCOUNTER — Encounter: Payer: Medicare Other | Admitting: Gastroenterology

## 2018-05-24 ENCOUNTER — Telehealth: Payer: Self-pay | Admitting: Internal Medicine

## 2018-05-24 MED ORDER — UMECLIDINIUM-VILANTEROL 62.5-25 MCG/INH IN AEPB: 1.0000 | INHALATION_SPRAY | Freq: Every day | RESPIRATORY_TRACT | 0 refills | Status: AC

## 2018-05-24 NOTE — Telephone Encounter (Signed)
lmtcb x1 for pt.  One sample of anoro has been placed up front for pt.

## 2018-05-24 NOTE — Telephone Encounter (Signed)
Patient would like sample of Anoro to get through weekend,. Her pharmacy is out until Monday

## 2018-05-24 NOTE — Telephone Encounter (Signed)
Patient returned call and is aware sample of Anoro has been placed up front.  No call back is required.

## 2018-05-31 NOTE — Progress Notes (Signed)
@Patient  ID: Yolanda Hill, female    DOB: [birth year], 78 y.o.   MRN: [id number]  Chief Complaint  Patient presents with  . Acute Visit    chronic cough worse in afternoons thru evening - sometimes productive (greenish yellow)    Referring provider: Shirline Frees, MD  HPI 78 year old female never smoker followed for chronic cough, chronic bronchitis, complicated by GERD, ectatic aorta  Tests/events: Allergy profile 03/17/2014-negative-total IgE only 3 with no specific elevations on the panel. CBC showed WBC 10,800, eosinophils 4%, PMN 69.3% ENT dr  told possibly GERD was told Acid reflux and caffiene is causing this cough Office Spirometry 05/01/2016-WNL. FVC 2.57/90%, FEV1 101/94%, ratio 0.78, FEF 25-75% 1.97/120%  CT chest 03/14/18 - Patchy debris throughout the right lower lobe airways, presumably mucoid material. Aspiration is a consideration. No acute consolidative airspace disease to suggest a pneumonia. Mild patchy subpleural reticulation and ground-glass attenuation in the dependent lung bases, right greater than left, mildly increased in the interval. No bronchiectasis or honeycombing. Findings remain indeterminate, potentially representing postinfectious/postinflammatory scarring or hypoventilatory changes, particularly given the airway findings above. The possibility of a mild interstitial lung disease such as nonspecific interstitial pneumonia (NSIP) or early usual interstitial pneumonia (UIP) cannot be excluded. Follow-up high-resolution chest CT study in 12 months may be obtained as clinically warranted.  PFT: PFT Results Latest Ref Rng & Units 03/21/2018  FVC-Pre L 2.55  FVC-Predicted Pre % 89  FVC-Post L 2.58  FVC-Predicted Post % 90  Pre FEV1/FVC % % 72  Post FEV1/FCV % % 74  FEV1-Pre L 1.84  FEV1-Predicted Pre % 85  FEV1-Post L 1.90  DLCO UNC% % 86  DLCO COR %Predicted % 97  TLC L 5.01  TLC % Predicted % 96  RV % Predicted % 99     OV 06/03/18 -  chronic cough Patient presents today for ongoing cough.  States that her cough is productive at times.  States that cough is worse in the afternoons after 4 PM.  Was last seen by Dr. Annamaria Boots on 03/21/2018.  She states that she has been compliant with aspiration precautions as advised at last visit.  She is compliant with Anoro and Proventil.  Compliant with Prilosec.  Patient states she has lost her flutter valve device.  She is requesting a new one today.  Her last CT scan was concerning for possible aspiration.  She states that she has improved some since her last visit but her cough has not went away.  She denies any recent fever.  She denies any shortness of breath, chest pain, or edema.    Allergies  Allergen Reactions  . Codeine Nausea Only  . Dymista [Azelastine-Fluticasone]     Nasal burning  . Simvastatin     Pt prefers not to take-personal reasonings    Immunization History  Administered Date(s) Administered  . Influenza Split 01/28/2008, 04/15/2009, 01/31/2010, 03/23/2011, 01/15/2014, 12/28/2014, 02/08/2015, 12/05/2016  . Influenza, High Dose Seasonal PF 12/15/2011, 01/20/2013, 01/09/2016  . Pneumococcal Conjugate-13 08/14/2013  . Pneumococcal Polysaccharide-23 07/25/2006  . Tdap 06/21/2011  . Zoster 06/21/2011    Past Medical History:  Diagnosis Date  . Abdominal aortic atherosclerosis ([place])   . Allergic rhinitis   . Anxiety   . Arthritis    osteoarthritis, cervical and lumbar  . Asthma   . Diverticulitis   . Eczema   . GERD (gastroesophageal reflux disease)   . Headache disorder 12/01/2014  . Headache(784.0)   . High blood pressure   .  High cholesterol   . Hypothyroid   . Idiopathic peripheral neuropathy   . Insomnia   . Interstitial lung disease ([place])   . Meniere disease   . Osteoarthritis   . Osteopenia   . Seasonal allergies   . Vitamin D deficiency     Tobacco History: Social History   Tobacco Use  Smoking Status Never Smoker  Smokeless Tobacco  Never Used   Counseling given: Yes   Outpatient Encounter Medications as of 06/03/2018  Medication Sig  . acetaminophen (TYLENOL) 500 MG tablet Take 1,000 mg by mouth daily as needed.  Marland Kitchen albuterol (PROVENTIL) (2.5 MG/3ML) 0.083% nebulizer solution Take 3 mLs (2.5 mg total) by nebulization every 6 (six) hours as needed for wheezing or shortness of breath.  Marland Kitchen b complex vitamins tablet Take 1 tablet by mouth daily.  . benzonatate (TESSALON) 200 MG capsule Take 1 capsule (200 mg total) by mouth 3 (three) times daily as needed for cough.  . Calcium Carbonate-Vitamin D (CALCIUM-VITAMIN D) 500-200 MG-UNIT per tablet Take 1 tablet by mouth daily.  . cholecalciferol (VITAMIN D) 1000 UNITS tablet Take 1,000 Units by mouth daily.  Marland Kitchen gabapentin (NEURONTIN) 300 MG capsule Take 1 capsule by mouth at bedtime.   Marland Kitchen LORazepam (ATIVAN) 0.5 MG tablet Take 0.5 mg by mouth every 6 (six) hours as needed for anxiety.   . Multiple Vitamin (MULTIVITAMIN) capsule Take 1 capsule by mouth daily.  Marland Kitchen omeprazole (PRILOSEC) 40 MG capsule Take 40 mg by mouth at bedtime.  Marland Kitchen Respiratory Therapy Supplies (FLUTTER) DEVI Blow through 4 times per set, three sets daily  . sodium chloride (OCEAN) 0.65 % SOLN nasal spray Place 1 spray into both nostrils as needed for congestion.  Marland Kitchen telmisartan-hydrochlorothiazide (MICARDIS HCT) 40-12.5 MG tablet TK 1 T PO QD  . umeclidinium-vilanterol (ANORO ELLIPTA) 62.5-25 MCG/INH AEPB Inhale 1 puff into the lungs daily.  Marland Kitchen zolpidem (AMBIEN) 10 MG tablet Take 5-10 mg by mouth At bedtime as needed for sleep.   . [DISCONTINUED] fluticasone (FLONASE) 50 MCG/ACT nasal spray Place into both nostrils daily.  . [DISCONTINUED] ipratropium (ATROVENT) 0.06 % nasal spray Place 2 sprays into both nostrils 4 (four) times daily.  . [DISCONTINUED] PEG-KCl-NaCl-NaSulf-Na Asc-C (PLENVU) 140 g SOLR Take 140 g by mouth as directed.  . montelukast (SINGULAIR) 10 MG tablet Take 1 tablet (10 mg total) by mouth at  bedtime.  Marland Kitchen Respiratory Therapy Supplies (FLUTTER) DEVI Use device 2-3 times a day to break up congestion   No facility-administered encounter medications on file as of 06/03/2018.      Review of Systems  Review of Systems  Constitutional: Negative.  Negative for chills and fever.  HENT: Negative.   Respiratory: Positive for cough. Negative for shortness of breath and wheezing.   Cardiovascular: Negative.  Negative for chest pain, palpitations and leg swelling.  Gastrointestinal: Negative.   Allergic/Immunologic: Negative.   Neurological: Negative.   Psychiatric/Behavioral: Negative.        Physical Exam  BP (!) 152/62 (BP Location: Left Arm, Patient Position: Sitting, Cuff Size: Normal)   Pulse 90   Ht 5\' 5"  (1.651 m)   Wt 163 lb (73.9 kg)   SpO2 97%   BMI 27.12 kg/m   Wt Readings from Last 5 Encounters:  06/03/18 163 lb (73.9 kg)  03/21/18 166 lb (75.3 kg)  01/29/18 168 lb 2 oz (76.3 kg)  11/19/17 168 lb 9.6 oz (76.5 kg)  10/16/17 168 lb 3.2 oz (76.3 kg)     Physical  Exam Vitals signs and nursing note reviewed.  Constitutional:      General: She is not in acute distress.    Appearance: She is well-developed.  Cardiovascular:     Rate and Rhythm: Normal rate and regular rhythm.  Pulmonary:     Effort: Pulmonary effort is normal. No respiratory distress.     Breath sounds: Normal breath sounds. No wheezing or rhonchi.  Musculoskeletal:        General: No swelling.  Neurological:     Mental Status: She is alert and oriented to person, place, and time.       Assessment & Plan:   Chronic cough Patient complains today of ongoing cough. She is following aspiration precautions. Last CT scan was abnormal. Showed aspiration. I agree with Dr. Annamaria Boots that most likely we are mostly seeing the effects of chronic bronchitis with intermittent aspiration.  We will watch over time for any progression of interstitial disease. I have discussed ongoing cough with Dr. Annamaria Boots  and we will refer to GI for possible fundoplication for aspiration.   Patient Instructions  Will give new flutter valve device - use 3 times daily Continue anoro Continue Proventil as needed Will start Singulair Will give mucinex samples Follow aspiration precautions Follow up with Dr. Annamaria Boots at his first available appointment in around 1 month  Aspiration Precautions, Adult Aspiration is the breathing in (inhalation) of a liquid or object into the lungs. Things that can be inhaled into the lungs include:  Food.  Any type of liquid, such as drinks or saliva.  Stomach contents, such as vomit or stomach acid. What are the signs of aspiration? Signs of aspiration include:  Coughing after swallowing food or liquids.  Clearing the throat often while eating.  Trouble breathing. This may include: ? Breathing quickly. ? Breathing very slowly. ? Loud breathing. ? Rumbling sounds from the lungs while breathing.  Coughing up phlegm (sputum) that: ? Is yellow, tan, or green. ? Has pieces of food in it. ? Is bad-smelling.  Having a hoarse, barky cough.  Not being able to speak.  A hoarse voice.  Drooling while eating.  A feeling of fullness in the throat or a feeling that something is stuck in the throat.  Choking often.  Having a runny noise while eating.  Coughing when lying down or having to sit up quickly after lying down.  A change in skin color. The skin may look red or blue.  Fever.  Watery eyes.  Pain in the chest or back.  A pained look on the face. What are the complications of aspiration? Complications of aspiration include:  Losing weight because the person is not absorbing needed nutrients.  Loss of enjoyment and the social benefits of eating.  Choking.  Lung irritation, if someone aspirates acidic food or drinks.  Lung infection (pneumonia).  Collection of infected liquid (pus) in the lungs (lung abscess). In serious cases, death can  occur. What can I do to prevent aspiration? Caring for someone who can eat and drink safely by mouth If you are caring for someone who can eat and drink safely through his or her mouth:  Have the person sit in an upright position when eating food or drinking fluids. This can be done in two ways: ? Have the person sit up in a chair. ? If sitting in a chair is not possible, position the person in bed so he or she is upright.  Remind the person to eat slowly and chew  well. Make sure the person is awake and alert while eating.  Do not distract the person. This is especially important for people with thinking or memory (cognitive) problems.  Allow foods to cool. Hot foods may be more difficult to swallow.  Provide small meals more frequently, instead of 3 large meals. This may reduce fatigue during eating.  Check the person's mouth thoroughly for leftover food after eating.  Keep the person sitting upright for 30-45 minutes after eating.  Do not serve food or drink during 2 hours or more before bedtime. General instructions Follow these general guidelines to prevent aspiration in someone who can eat and drink safely by mouth:  Never put food or liquids in the mouth of a person who is not fully alert.  Feed small amounts of food. Do not force feed.  For a person who is on a diet for swallowing difficulty (dysphagia diet), follow the recommended food and drink consistency. For example, in dysphagia diet level 1, thicken liquids to pudding-like consistency.  Use as little water as possible when brushing the person's teeth or cleaning his or her mouth.  Provide oral care before and after meals.  Use adaptive devices such as cut-out cups, straws, or utensils as told by the health care provider.  Crush pills and put them in soft food such as pudding or ice cream. Some pills should not be crushed. Check with the health care provider before crushing any medicine. Contact a health care  provider if:  The person has a feeding tube, and the feeding tube residual amount is too high.  The person has a fever.  The person tries to avoid food or water, such as refusing to eat, drink, or be fed, or is eating less than normal.  The person may have aspirated food or liquid.  You notice warning signs, such as choking or coughing, when the person eats or drinks. Get help right away if:  The person has trouble breathing or starts to breathe quickly.  The person is breathing very slowly or stops breathing.  The person coughs a lot after eating or drinking.  The person has a long-lasting (chronic) cough.  The person coughs up thick, yellow, or tan sputum.  If someone is choking on food or an object, perform the Heimlich maneuver (abdominal thrusts).  The person has symptoms of pneumonia, such as: ? Coughing a lot. ? Coughing up mucus with a bad smell or blood in it. ? Feeling short of breath. ? Complaining of chest pain. ? Sweating, fever, and chills. ? Feeling tired. ? Complaining of trouble breathing. ? Wheezing.  The person cannot stop choking.  The person is unable to breathe, turns blue, faints, or seems confused. These symptoms may represent a serious problem that is an emergency. Do not wait to see if the symptoms will go away. Get medical help right away. Call your local emergency services (911 in the [place]  Summary  Aspiration is the breathing in (inhalation) of a liquid or object into the lungs. Things that can be inhaled into the lungs include food, liquids, saliva, or stomach contents.  Aspiration can cause pneumonia or choking.  One sign of aspiration is coughing after swallowing food or liquids.  Contact a health care provider if you notice signs of aspiration.        Fenton Foy, NP 06/03/2018

## 2018-06-03 ENCOUNTER — Ambulatory Visit: Payer: Medicare Other | Admitting: Nurse Practitioner

## 2018-06-03 ENCOUNTER — Telehealth: Payer: Self-pay

## 2018-06-03 ENCOUNTER — Encounter: Payer: Self-pay | Admitting: Nurse Practitioner

## 2018-06-03 VITALS — BP 152/62 | HR 90 | Ht 65.0 in | Wt 163.0 lb

## 2018-06-03 DIAGNOSIS — R053 Chronic cough: Secondary | ICD-10-CM

## 2018-06-03 DIAGNOSIS — R05 Cough: Secondary | ICD-10-CM

## 2018-06-03 MED ORDER — FLUTTER DEVI: 0 refills | Status: DC

## 2018-06-03 MED ORDER — MONTELUKAST SODIUM 10 MG PO TABS: 10.0000 mg | ORAL_TABLET | Freq: Every day | ORAL | 11 refills | Status: DC

## 2018-06-03 NOTE — Assessment & Plan Note (Signed)
Patient complains today of ongoing cough. She is following aspiration precautions. Last CT scan was abnormal. Showed aspiration. I agree with Dr. Annamaria Boots that most likely we are mostly seeing the effects of chronic bronchitis with intermittent aspiration.  We will watch over time for any progression of interstitial disease. I have discussed ongoing cough with Dr. Annamaria Boots and we will refer to GI for possible fundoplication for aspiration.   Patient Instructions  Will give new flutter valve device - use 3 times daily Continue anoro Continue Proventil as needed Will start Singulair Will give mucinex samples Follow aspiration precautions Follow up with Dr. Annamaria Boots at his first available appointment in around 1 month  Aspiration Precautions, Adult Aspiration is the breathing in (inhalation) of a liquid or object into the lungs. Things that can be inhaled into the lungs include:  Food.  Any type of liquid, such as drinks or saliva.  Stomach contents, such as vomit or stomach acid. What are the signs of aspiration? Signs of aspiration include:  Coughing after swallowing food or liquids.  Clearing the throat often while eating.  Trouble breathing. This may include: ? Breathing quickly. ? Breathing very slowly. ? Loud breathing. ? Rumbling sounds from the lungs while breathing.  Coughing up phlegm (sputum) that: ? Is yellow, tan, or green. ? Has pieces of food in it. ? Is bad-smelling.  Having a hoarse, barky cough.  Not being able to speak.  A hoarse voice.  Drooling while eating.  A feeling of fullness in the throat or a feeling that something is stuck in the throat.  Choking often.  Having a runny noise while eating.  Coughing when lying down or having to sit up quickly after lying down.  A change in skin color. The skin may look red or blue.  Fever.  Watery eyes.  Pain in the chest or back.  A pained look on the face. What are the complications of  aspiration? Complications of aspiration include:  Losing weight because the person is not absorbing needed nutrients.  Loss of enjoyment and the social benefits of eating.  Choking.  Lung irritation, if someone aspirates acidic food or drinks.  Lung infection (pneumonia).  Collection of infected liquid (pus) in the lungs (lung abscess). In serious cases, death can occur. What can I do to prevent aspiration? Caring for someone who can eat and drink safely by mouth If you are caring for someone who can eat and drink safely through his or her mouth:  Have the person sit in an upright position when eating food or drinking fluids. This can be done in two ways: ? Have the person sit up in a chair. ? If sitting in a chair is not possible, position the person in bed so he or she is upright.  Remind the person to eat slowly and chew well. Make sure the person is awake and alert while eating.  Do not distract the person. This is especially important for people with thinking or memory (cognitive) problems.  Allow foods to cool. Hot foods may be more difficult to swallow.  Provide small meals more frequently, instead of 3 large meals. This may reduce fatigue during eating.  Check the person's mouth thoroughly for leftover food after eating.  Keep the person sitting upright for 30-45 minutes after eating.  Do not serve food or drink during 2 hours or more before bedtime. General instructions Follow these general guidelines to prevent aspiration in someone who can eat and drink safely by  mouth:  Never put food or liquids in the mouth of a person who is not fully alert.  Feed small amounts of food. Do not force feed.  For a person who is on a diet for swallowing difficulty (dysphagia diet), follow the recommended food and drink consistency. For example, in dysphagia diet level 1, thicken liquids to pudding-like consistency.  Use as little water as possible when brushing the person's  teeth or cleaning his or her mouth.  Provide oral care before and after meals.  Use adaptive devices such as cut-out cups, straws, or utensils as told by the health care provider.  Crush pills and put them in soft food such as pudding or ice cream. Some pills should not be crushed. Check with the health care provider before crushing any medicine. Contact a health care provider if:  The person has a feeding tube, and the feeding tube residual amount is too high.  The person has a fever.  The person tries to avoid food or water, such as refusing to eat, drink, or be fed, or is eating less than normal.  The person may have aspirated food or liquid.  You notice warning signs, such as choking or coughing, when the person eats or drinks. Get help right away if:  The person has trouble breathing or starts to breathe quickly.  The person is breathing very slowly or stops breathing.  The person coughs a lot after eating or drinking.  The person has a long-lasting (chronic) cough.  The person coughs up thick, yellow, or tan sputum.  If someone is choking on food or an object, perform the Heimlich maneuver (abdominal thrusts).  The person has symptoms of pneumonia, such as: ? Coughing a lot. ? Coughing up mucus with a bad smell or blood in it. ? Feeling short of breath. ? Complaining of chest pain. ? Sweating, fever, and chills. ? Feeling tired. ? Complaining of trouble breathing. ? Wheezing.  The person cannot stop choking.  The person is unable to breathe, turns blue, faints, or seems confused. These symptoms may represent a serious problem that is an emergency. Do not wait to see if the symptoms will go away. Get medical help right away. Call your local emergency services (911 in the [place]  Summary  Aspiration is the breathing in (inhalation) of a liquid or object into the lungs. Things that can be inhaled into the lungs include food, liquids, saliva, or stomach  contents.  Aspiration can cause pneumonia or choking.  One sign of aspiration is coughing after swallowing food or liquids.  Contact a health care provider if you notice signs of aspiration.

## 2018-06-03 NOTE — Patient Instructions (Signed)
Will give new flutter valve device - use 3 times daily Continue anoro Continue Proventil as needed Will start Singulair Will give mucinex samples Follow aspiration precautions Follow up with Dr. Annamaria Boots at his first available appointment in around 1 month  Aspiration Precautions, Adult Aspiration is the breathing in (inhalation) of a liquid or object into the lungs. Things that can be inhaled into the lungs include:  Food.  Any type of liquid, such as drinks or saliva.  Stomach contents, such as vomit or stomach acid. What are the signs of aspiration? Signs of aspiration include:  Coughing after swallowing food or liquids.  Clearing the throat often while eating.  Trouble breathing. This may include: ? Breathing quickly. ? Breathing very slowly. ? Loud breathing. ? Rumbling sounds from the lungs while breathing.  Coughing up phlegm (sputum) that: ? Is yellow, tan, or green. ? Has pieces of food in it. ? Is bad-smelling.  Having a hoarse, barky cough.  Not being able to speak.  A hoarse voice.  Drooling while eating.  A feeling of fullness in the throat or a feeling that something is stuck in the throat.  Choking often.  Having a runny noise while eating.  Coughing when lying down or having to sit up quickly after lying down.  A change in skin color. The skin may look red or blue.  Fever.  Watery eyes.  Pain in the chest or back.  A pained look on the face. What are the complications of aspiration? Complications of aspiration include:  Losing weight because the person is not absorbing needed nutrients.  Loss of enjoyment and the social benefits of eating.  Choking.  Lung irritation, if someone aspirates acidic food or drinks.  Lung infection (pneumonia).  Collection of infected liquid (pus) in the lungs (lung abscess). In serious cases, death can occur. What can I do to prevent aspiration? Caring for someone who can eat and drink safely by  mouth If you are caring for someone who can eat and drink safely through his or her mouth:  Have the person sit in an upright position when eating food or drinking fluids. This can be done in two ways: ? Have the person sit up in a chair. ? If sitting in a chair is not possible, position the person in bed so he or she is upright.  Remind the person to eat slowly and chew well. Make sure the person is awake and alert while eating.  Do not distract the person. This is especially important for people with thinking or memory (cognitive) problems.  Allow foods to cool. Hot foods may be more difficult to swallow.  Provide small meals more frequently, instead of 3 large meals. This may reduce fatigue during eating.  Check the person's mouth thoroughly for leftover food after eating.  Keep the person sitting upright for 30-45 minutes after eating.  Do not serve food or drink during 2 hours or more before bedtime. General instructions Follow these general guidelines to prevent aspiration in someone who can eat and drink safely by mouth:  Never put food or liquids in the mouth of a person who is not fully alert.  Feed small amounts of food. Do not force feed.  For a person who is on a diet for swallowing difficulty (dysphagia diet), follow the recommended food and drink consistency. For example, in dysphagia diet level 1, thicken liquids to pudding-like consistency.  Use as little water as possible when brushing the person's teeth or  cleaning his or her mouth.  Provide oral care before and after meals.  Use adaptive devices such as cut-out cups, straws, or utensils as told by the health care provider.  Crush pills and put them in soft food such as pudding or ice cream. Some pills should not be crushed. Check with the health care provider before crushing any medicine. Contact a health care provider if:  The person has a feeding tube, and the feeding tube residual amount is too high.  The  person has a fever.  The person tries to avoid food or water, such as refusing to eat, drink, or be fed, or is eating less than normal.  The person may have aspirated food or liquid.  You notice warning signs, such as choking or coughing, when the person eats or drinks. Get help right away if:  The person has trouble breathing or starts to breathe quickly.  The person is breathing very slowly or stops breathing.  The person coughs a lot after eating or drinking.  The person has a long-lasting (chronic) cough.  The person coughs up thick, yellow, or tan sputum.  If someone is choking on food or an object, perform the Heimlich maneuver (abdominal thrusts).  The person has symptoms of pneumonia, such as: ? Coughing a lot. ? Coughing up mucus with a bad smell or blood in it. ? Feeling short of breath. ? Complaining of chest pain. ? Sweating, fever, and chills. ? Feeling tired. ? Complaining of trouble breathing. ? Wheezing.  The person cannot stop choking.  The person is unable to breathe, turns blue, faints, or seems confused. These symptoms may represent a serious problem that is an emergency. Do not wait to see if the symptoms will go away. Get medical help right away. Call your local emergency services (911 in the [place]  Summary  Aspiration is the breathing in (inhalation) of a liquid or object into the lungs. Things that can be inhaled into the lungs include food, liquids, saliva, or stomach contents.  Aspiration can cause pneumonia or choking.  One sign of aspiration is coughing after swallowing food or liquids.  Contact a health care provider if you notice signs of aspiration.

## 2018-06-03 NOTE — Telephone Encounter (Signed)
Patient returning Denise's call [phone].

## 2018-06-03 NOTE — Telephone Encounter (Signed)
Left message for patient to call after her OV today with Lazaro Arms, NP.  Tonya consulted with Dr. Annamaria Boots after patient left the office re: recent chest CT which showed aspiration. Due to continued chronic cough, it is suggested patient is referred to GI for consult for aspiration (consider fundoplication with referral note).  Patient was unaware of this suggested referral at the [facility] today 06/03/18 so this call was made to see if patient would be agreeable to this GI referral for aspiration.  Left message for patient to call back.  Referral order HAS NOT been placed and would need to be entered if the patient agrees.  Will wait for call back or follow up if no return call.

## 2018-06-04 NOTE — Telephone Encounter (Signed)
Contacted patient by phone regarding note from Lazaro Arms, NP for GI referral due to aspiration noted on CT scan.  Informed patient that TN had discussed with Dr. Annamaria Boots and referral is recommended due to chronic cough continuation.  Patient acknowledged understanding and agrees to referral and has not seen a GI specialist before and had no request for MD.  Patient advised she would be receiving a call to set up this appointment and to let us know if she needs anything further.  Order placed for GI referral.

## 2018-06-04 NOTE — Addendum Note (Signed)
Addended by: Joella Prince on: 06/04/2018 10:34 AM   Modules accepted: Orders

## 2018-07-03 ENCOUNTER — Ambulatory Visit: Payer: Medicare Other | Admitting: Internal Medicine

## 2018-08-05 ENCOUNTER — Telehealth: Payer: Self-pay | Admitting: Internal Medicine

## 2018-08-05 DIAGNOSIS — J418 Mixed simple and mucopurulent chronic bronchitis: Secondary | ICD-10-CM

## 2018-08-05 NOTE — Telephone Encounter (Signed)
Ok to order  DME [facility]- please replace broken compressor nebulizer machine   Dx COPD mixed type

## 2018-08-05 NOTE — Telephone Encounter (Signed)
Spoke with patient. She is aware that the order will be placed. She verbalized understanding. Order has been placed. Nothing further needed at time of call.

## 2018-08-05 NOTE — Telephone Encounter (Signed)
Primary Pulmonologist: CY Last office visit and with whom: TN on 06/03/2018 What do we see them for (pulmonary problems): Chronic Cough Last OV assessment/plan:  Editor: Fenton Foy, NP (Nurse Practitioner)    Will give new flutter valve device - use 3 times daily Continue anoro Continue Proventil as needed Will start Singulair Will give mucinex samples Follow aspiration precautions Follow up with Dr. Annamaria Boots at his first available appointment in around 1 month     Was appointment offered to patient (explain)?  no  Reason for call: patient called in stating that her nebulizer machine is not working properly as is in need of a new machine. Her DME is [facility], she went by there today in regards to the machine no working well. They stated they needed a new script for nebulizer machine from Dr. Janee Morn office.  CY please advise. Thank you.

## 2018-08-08 ENCOUNTER — Encounter: Payer: Self-pay | Admitting: Internal Medicine

## 2018-08-08 ENCOUNTER — Ambulatory Visit: Payer: Medicare Other | Admitting: Internal Medicine

## 2018-08-08 ENCOUNTER — Other Ambulatory Visit: Payer: Self-pay

## 2018-08-08 VITALS — BP 120/78 | HR 74 | Ht 64.5 in | Wt 160.0 lb

## 2018-08-08 DIAGNOSIS — J849 Interstitial pulmonary disease, unspecified: Secondary | ICD-10-CM | POA: Diagnosis not present

## 2018-08-08 DIAGNOSIS — K219 Gastro-esophageal reflux disease without esophagitis: Secondary | ICD-10-CM | POA: Diagnosis not present

## 2018-08-08 DIAGNOSIS — R9389 Abnormal findings on diagnostic imaging of other specified body structures: Secondary | ICD-10-CM | POA: Diagnosis not present

## 2018-08-08 DIAGNOSIS — J418 Mixed simple and mucopurulent chronic bronchitis: Secondary | ICD-10-CM

## 2018-08-08 MED ORDER — LEVOFLOXACIN 500 MG PO TABS: 500.0000 mg | ORAL_TABLET | Freq: Every day | ORAL | 0 refills | Status: DC

## 2018-08-08 NOTE — Progress Notes (Signed)
HPI  female never smoker followed for chronic cough, chronic bronchitis, complicated by GERD, ectatic aorta Allergy profile 03/17/2014-negative-total IgE only 3 with no specific elevations on the panel. CBC showed WBC 10,800, eosinophils 4%, PMN 69.3%  ENT dr  told possibly GERD was told Acid reflux and caffiene is causing this cough Office Spirometry 05/01/2016-WNL. FVC 2.57/90%, FEV1 101/94%, ratio 0.78, FEF 25-75% 1.97/120% PFT 03/21/18-WNL or minimal obstruction.  Normal diffusion.  FVC 2.58/90%, FEV1 1.90/88%, ratio 0.74, TLC 96%, DLCO 86% CT chest Hi Res 03/14/2018- 1. Patchy debris throughout the right lower lobe airways, presumably mucoid material. Aspiration is a consideration _____________________________________________-   03/21/2018- 78 year old female never smoker followed for chronic cough, chronic bronchitis, complicated by GERD, aortic aneurysm Anoro, neb albuterol, Atrovent 0.06% nasal spray, -----Chronic Bronchitis: Review PFT with patient today.  Cough has been much better as she has been careful with reflux precautions and has used Anoro. PFT 03/21/18-WNL or minimal obstruction.  Normal diffusion.  FVC 2.58/90%, FEV1 1.90/88%, ratio 0.74, TLC 96%, DLCO 86% CT chest Hi Res 03/14/2018- 1. Patchy debris throughout the right lower lobe airways, presumably mucoid material. Aspiration is a consideration. No acute consolidative airspace disease to suggest a pneumonia. 2. Mild patchy subpleural reticulation and ground-glass attenuation in the dependent lung bases, right greater than left, mildly increased in the interval. No bronchiectasis or honeycombing. Findings remain indeterminate, potentially representing postinfectious/postinflammatory scarring or hypoventilatory changes, particularly given the airway findings above. The possibility of a mild interstitial lung disease such as nonspecific interstitial pneumonia (NSIP) or early usual interstitial pneumonia (UIP) cannot be  excluded. Follow-up high-resolution chest CT study in 12 months may be obtained as clinically warranted. 3. 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 Recommend annual imaging followup by CTA or MRA. This recommendation follows 2010 ACCF/AHA/AATS/ACR/ASA/SCA/SCAI/SIR/STS/SVM Guidelines for the Diagnosis and Management of Patients with Thoracic Aortic Disease. Circulation. 2010; 121: U725-D664. 4. Two-vessel coronary atherosclerosis.  08/08/2018-  78 year old female never smoker followed for ILD,  chronic cough, chronic bronchitis, complicated by GERD/ probable aspiration, aortic aneurysm Anoro, neb albuterol, Atrovent 0.06% nasal spray, -----chronic cough, takes Anoro & nebulizer, reports nasal "wheeze" at night, takes saline for it and states she wonders if she may need another nasal medication LAB- Respiratory Allergy Profile 11/27/17- neg for routine environmental allergens, with total IgE < 2 Cough is only occasionally productive and not noticed at night.  She usually coughs when she is sitting up watching TV.  Bribes scant yellowish mucus with no blood, no fever.  Last antibiotic may have been Augmentin, from Dr. Kenton Kingfisher last winter.  We again discussed last CT scan consistent with aspirated material and airways.  She hears some "npose whistle" at night.  Question of ILD to be reassessed at follow-up HRCT in December, 2020.  ROS-see HPI   + = positive Constitutional:   No-   weight loss, night sweats, fevers, chills, fatigue, lassitude. HEENT:   No-  headaches, difficulty swallowing, tooth/dental problems, sore throat,       No-  sneezing, itching, ear ache,                               +nasal congestion, post nasal drip,  CV:  No-   chest pain, orthopnea, PND, swelling in lower extremities, anasarca,  dizziness, palpitations Resp: No-   shortness of breath with exertion or at rest.              + productive cough,   + cough,  No- coughing up  of blood.              No-   change in color of mucus.  No- wheezing.   Skin: No-   rash or lesions. GI:  No-   heartburn, indigestion, abdominal pain, nausea, vomiting,  GU: . MS:   Neuro-     nothing unusual Psych:  No- change in mood or affect. No depression or anxiety.  No memory loss.  OBJ- Physical Exam General- Alert, Oriented, Affect-appropriate, Distress- none acute Skin- rash-none, lesions- none, excoriation- none Lymphadenopathy- none Head- atraumatic            Eyes- Gross vision intact, PERRLA, conjunctivae and secretions clear            Ears- Hearing, canals-normal            Nose- Clear, no-Septal dev, mucus, polyps, erosion, perforation             Throat- Mallampati III , mucosa clear , drainage- none, tonsils- atrophic, hoarse + Neck- flexible , trachea midline, no stridor , thyroid nl, carotid no bruit Chest - symmetrical excursion , unlabored           Heart/CV- RRR , no murmur , no gallop  , no rub, nl s1 s2                           - JVD- none , edema- none, stasis changes- none, varices- none           Lung-  + coarse/ unlabored, wheeze- none, cough+ raspy , dullness-none, rub- none           Chest wall-  Abd-  Br/ Gen/ Rectal- Not done, not indicated Extrem- cyanosis- none, clubbing, none, atrophy- none, strength- nl Neuro- grossly intact to observation

## 2018-08-08 NOTE — Patient Instructions (Signed)
Script sent for levaquin antibiotic  Stay upright for t least 30 minutes after you eat. This reduces the tendency for stomach juice to reflux up to where it might go down into your lungs.   Do lots of walking to keep your stamina up.   We are planing to repeat the CT scan of your lungs as scheduled.

## 2018-08-25 NOTE — Assessment & Plan Note (Signed)
Strong probability this is from chronic low-grade aspiration. Plan-emphasis on reflux precautions.  Levaquin 500 mg, CBC with differential

## 2018-08-25 NOTE — Assessment & Plan Note (Signed)
Her chronic pulmonary situation certainly favors repeated silent aspiration.  We are emphasizing reflux precautions.

## 2018-08-25 NOTE — Assessment & Plan Note (Signed)
Question of interstitial lung disease to be reassessed with follow-up high-resolution CT chest in December, 2020

## 2018-08-27 ENCOUNTER — Other Ambulatory Visit: Payer: Self-pay

## 2018-08-28 ENCOUNTER — Encounter: Payer: Self-pay | Admitting: Gastroenterology

## 2018-08-28 ENCOUNTER — Ambulatory Visit (INDEPENDENT_AMBULATORY_CARE_PROVIDER_SITE_OTHER): Payer: Medicare Other | Admitting: Gastroenterology

## 2018-08-28 ENCOUNTER — Other Ambulatory Visit: Payer: Self-pay

## 2018-08-28 VITALS — Ht 64.0 in | Wt 163.0 lb

## 2018-08-28 DIAGNOSIS — K219 Gastro-esophageal reflux disease without esophagitis: Secondary | ICD-10-CM | POA: Diagnosis not present

## 2018-08-28 DIAGNOSIS — R053 Chronic cough: Secondary | ICD-10-CM

## 2018-08-28 DIAGNOSIS — R05 Cough: Secondary | ICD-10-CM | POA: Diagnosis not present

## 2018-08-28 NOTE — Progress Notes (Signed)
This patient contacted our office requesting a physician telemedicine consultation regarding clinical questions and/or test results.  If new patient, they were referred by Keturah Barre, MD ( pulmonary)  Participants on the conference : myself and patient and her son  The patient consented to this consultation and was aware that a charge will be placed through their insurance.  I was in my office and the patient was at home   Encounter time:  Total time 40 minutes, with 31 minutes spent with patient on Doximity  (Extensive review and patient interview required given complex scenario and work-up over years.) _____________________________________________________________________________________________               [facility] Progress Note  Chief Complaint: Chronic cough  Subjective  History: Saw me in Oct 2019 for questionable abnormality in sigmoid colon on CT urogram.  She decided not to pursue colonoscopy, at least partially because of her chronic cough.  She has had a chronic cough for years, it always happens during the day, more so if she has been talking a lot.  It looks like this question was posed to Dr. Deatra Ina in 2013, when he did an upper endoscopy with cough is the indication.  She had a 2 cm hiatal hernia, no esophagitis, a Bravo pH study was formed (results below).  She has seen ENT in the past, according to recent pulmonary clinic note.  She has had high resolution CT scan chest in December 2019 suggesting some airspace disease and bronchial thickening with possible aspiration on the differential.  I do not have access to primary care notes, but Andreka and her son tell me that she has been on pantoprazole 20 mg for a long time, and the dose was recently increased to 40 mg.  She also thought her ARB medicine might be related to the cough, and she has started taking it every other day.  I encouraged her to speak with her primary care provider about this ASAP so she  is on the right dose of blood pressure medication.   She reports never having had heartburn, even before PPI was started.  She does not have regurgitation, she does not wake up at night with chest discomfort, coughing/choking, even before she was on PPI.  On recent recommendation of pulmonary, she elevated head of bed by sleeping on a few pillows.  Her cough is lately been improved, though she is not sure why. Jaidah denies dysphagia or odynophagia, nausea vomiting early satiety or weight loss.  She denies rectal bleeding.  ROS: Cardiovascular:  no chest pain Respiratory: no dyspnea -cough, usually dry but sometimes productive of mucoid sputum. Remainder systems negative except as above The patient's Past Medical, Family and Social History were reviewed and are on file in the EMR.  Objective:  Med list reviewed  Current Outpatient Medications:  .  acetaminophen (TYLENOL) 500 MG tablet, Take 1,000 mg by mouth daily as needed., Disp: , Rfl:  .  albuterol (PROVENTIL) (2.5 MG/3ML) 0.083% nebulizer solution, Take 3 mLs (2.5 mg total) by nebulization every 6 (six) hours as needed for wheezing or shortness of breath., Disp: 75 mL, Rfl: 12 .  b complex vitamins tablet, Take 1 tablet by mouth daily., Disp: , Rfl:  .  benzonatate (TESSALON) 200 MG capsule, Take 1 capsule (200 mg total) by mouth 3 (three) times daily as needed for cough., Disp: 30 capsule, Rfl: 1 .  Calcium Carbonate-Vitamin D (CALCIUM-VITAMIN D) 500-200 MG-UNIT per tablet, Take 1 tablet by mouth  daily., Disp: , Rfl:  .  cholecalciferol (VITAMIN D) 1000 UNITS tablet, Take 1,000 Units by mouth daily., Disp: , Rfl:  .  gabapentin (NEURONTIN) 300 MG capsule, Take 1 capsule by mouth at bedtime. , Disp: , Rfl: 11 .  LORazepam (ATIVAN) 0.5 MG tablet, Take 0.5 mg by mouth every 6 (six) hours as needed for anxiety. , Disp: , Rfl:  .  montelukast (SINGULAIR) 10 MG tablet, Take 1 tablet (10 mg total) by mouth at bedtime., Disp: 30 tablet, Rfl:  11 .  Multiple Vitamin (MULTIVITAMIN) capsule, Take 1 capsule by mouth daily., Disp: , Rfl:  .  omeprazole (PRILOSEC) 40 MG capsule, Take 40 mg by mouth at bedtime., Disp: , Rfl: 1 .  pantoprazole (PROTONIX) 20 MG tablet, Take 20 mg by mouth daily., Disp: , Rfl:  .  Respiratory Therapy Supplies (FLUTTER) DEVI, Blow through 4 times per set, three sets daily, Disp: 1 each, Rfl: 0 .  Respiratory Therapy Supplies (FLUTTER) DEVI, Use device 2-3 times a day to break up congestion, Disp: 1 each, Rfl: 0 .  sodium chloride (OCEAN) 0.65 % SOLN nasal spray, Place 1 spray into both nostrils as needed for congestion., Disp: , Rfl:  .  telmisartan-hydrochlorothiazide (MICARDIS HCT) 40-12.5 MG tablet, TK 1 T PO QD, Disp: , Rfl: 5 .  umeclidinium-vilanterol (ANORO ELLIPTA) 62.5-25 MCG/INH AEPB, Inhale 1 puff into the lungs daily., Disp: , Rfl:  .  zolpidem (AMBIEN) 10 MG tablet, Take 5-10 mg by mouth At bedtime as needed for sleep. , Disp: , Rfl:   Is on pantoprazole 40 mg (does was increased recently?) - Rx by Dr. Mannie Stabile (family practice) few weeks ago   No exam-virtual visit She is well-appearing, conversational and breathing comfortably on room air.  Her vocal quality is normal and she did not cough during the entire encounter.  She says that normally if she talks for long period, she will have coughing fits, including with a recent primary care telemedicine visit.  Radiologic studies:  CLINICAL DATA:  Chronic. Recurrent upper respiratory infections. Follow-up possible interstitial lung disease.   EXAM: CT CHEST WITHOUT CONTRAST   TECHNIQUE: Multidetector CT imaging of the chest was performed following the standard protocol without intravenous contrast. High resolution imaging of the lungs, as well as inspiratory and expiratory imaging, was performed.   COMPARISON:  10/16/2017 chest radiograph. 11/07/2016 high-resolution chest CT.   FINDINGS: Cardiovascular: Normal heart size. No significant  pericardial effusion/thickening. Left anterior descending and right coronary atherosclerosis. 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 Normal caliber pulmonary arteries.   Mediastinum/Nodes: No discrete thyroid nodules. Unremarkable esophagus. No pathologically enlarged axillary, mediastinal or hilar lymph nodes, noting limited sensitivity for the detection of hilar adenopathy on this noncontrast study.   Lungs/Pleura: No pneumothorax. No pleural effusion. There is patchy debris throughout the right lower lobe airways is, presumably representing mucoid material. No acute consolidative airspace disease or lung masses. Stable calcified 4 mm peripheral right lower lobe granuloma. Right middle lobe 3 mm solid pulmonary nodule (series 3/image 76) is stable and considered benign. No new significant pulmonary nodules. No significant air trapping on the expiration sequence. Mild patchy subpleural reticulation and ground-glass attenuation in the dependent basilar lower lobes bilaterally, right greater than left, mildly increased from prior. Increased scattered small parenchymal bands at the right lung base. No significant regions of traction bronchiectasis, architectural distortion or frank honeycombing. Diffuse bronchial wall thickening.   Upper abdomen: Cholecystectomy.   Musculoskeletal: No aggressive  appearing focal osseous lesions. Mild thoracic spondylosis.   IMPRESSION: 1. Patchy debris throughout the right lower lobe airways, presumably mucoid material. Aspiration is a consideration. No acute consolidative airspace disease to suggest a pneumonia. 2. Mild patchy subpleural reticulation and ground-glass attenuation in the dependent lung bases, right greater than left, mildly increased in the interval. No bronchiectasis or honeycombing. Findings remain indeterminate, potentially representing postinfectious/postinflammatory scarring or  hypoventilatory changes, particularly given the airway findings above. The possibility of a mild interstitial lung disease such as nonspecific interstitial pneumonia (NSIP) or early usual interstitial pneumonia (UIP) cannot be excluded. Follow-up high-resolution chest CT study in 12 months may be obtained as clinically warranted. 3. 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 Recommend annual imaging followup by CTA or MRA. This recommendation follows 2010 ACCF/AHA/AATS/ACR/ASA/SCA/SCAI/SIR/STS/SVM Guidelines for the Diagnosis and Management of Patients with Thoracic Aortic Disease. Circulation. 2010; 121: F093-A355. 4. Two-vessel coronary atherosclerosis.   Aortic Atherosclerosis (ICD10-I70.0).     Electronically Signed   By: Ilona Sorrel M.D.   On: 03/15/2018 08:30 _________________________________________  Bravo pH study by Dr. Deatra Ina in 2013 shows 71 episodes of reflux on both day 1 and day 2, very few of them were longer than 5 minutes.  Total DeMeester score 41  @ASSESSMENTPLANBEGIN @ Assessment: Encounter Diagnoses  Name Primary?  . Chronic cough Yes  . Gastroesophageal reflux disease, esophagitis presence not specified    This patient has GERD documented on pH study in 2013.  She does not have any nocturnal symptoms, and it is very difficult to know to what extent GERD may be related to her chronic cough.  Given that uncertainty, I would not advocate fundoplication at this point.  She says she would not consider surgery unless it were a life-threatening problem. A once daily PPI can be taken for what good it may do in this circumstance, and should be re-dose to supper meal. What is likely to be more helpful are nonpharmacologic antireflux measures.  We reviewed those, the most important being not eating within several hours of bed, and elevating head of bed.  I recommended putting a bed wedge between mattress and box spring, which usually works better than several pillows.  She feels the cough is lately been improved, and she would like to speak with primary care about her antihypertensive medicines.  I have no other tests planned at present, and would be glad to see her again as needed.  Nelida Meuse III

## 2018-09-23 ENCOUNTER — Ambulatory Visit: Payer: Medicare Other | Admitting: Internal Medicine

## 2018-10-01 ENCOUNTER — Other Ambulatory Visit: Payer: Self-pay | Admitting: Pulmonary Disease

## 2018-10-01 DIAGNOSIS — J410 Simple chronic bronchitis: Secondary | ICD-10-CM

## 2018-10-09 ENCOUNTER — Ambulatory Visit: Payer: Medicare Other | Admitting: Internal Medicine

## 2019-02-07 ENCOUNTER — Other Ambulatory Visit: Payer: Self-pay

## 2019-02-07 ENCOUNTER — Ambulatory Visit: Payer: Medicare Other | Admitting: Internal Medicine

## 2019-02-07 ENCOUNTER — Encounter: Payer: Self-pay | Admitting: Internal Medicine

## 2019-02-07 DIAGNOSIS — J418 Mixed simple and mucopurulent chronic bronchitis: Secondary | ICD-10-CM

## 2019-02-07 DIAGNOSIS — R9389 Abnormal findings on diagnostic imaging of other specified body structures: Secondary | ICD-10-CM

## 2019-02-07 NOTE — Patient Instructions (Signed)
We will reschedule the CT scan of your chest to see if the scarring and inflammation have progressed over the past year.  Ok to drop off Anoro  Keep up the acid blocker therapy  Please call if we can help

## 2019-02-07 NOTE — Progress Notes (Signed)
HPI  female never smoker followed for chronic cough, chronic bronchitis, complicated by GERD, ectatic aorta Allergy profile 03/17/2014-negative-total IgE only 3 with no specific elevations on the panel. CBC showed WBC 10,800, eosinophils 4%, PMN 69.3%  ENT dr  told possibly GERD was told Acid reflux and caffiene is causing this cough Office Spirometry 05/01/2016-WNL. FVC 2.57/90%, FEV1 101/94%, ratio 0.78, FEF 25-75% 1.97/120% PFT 03/21/18-WNL or minimal obstruction.  Normal diffusion.  FVC 2.58/90%, FEV1 1.90/88%, ratio 0.74, TLC 96%, DLCO 86% CT chest Hi Res 03/14/2018- 1. Patchy debris throughout the right lower lobe airways, presumably mucoid material. Aspiration is a consideration LAB- Respiratory Allergy Profile 11/27/17- neg for routine environmental allergens, with total IgE < 2 _____________________________________________-  08/08/2018-  78 year old female never smoker followed for ILD,  chronic cough, chronic bronchitis, complicated by GERD/ probable aspiration, aortic aneurysm Anoro, neb albuterol, Atrovent 0.06% nasal spray, -----chronic cough, takes Anoro & nebulizer, reports nasal "wheeze" at night, takes saline for it and states she wonders if she may need another nasal medication LAB- Respiratory Allergy Profile 11/27/17- neg for routine environmental allergens, with total IgE < 2 Cough is only occasionally productive and not noticed at night.  She usually coughs when she is sitting up watching TV.  Bribes scant yellowish mucus with no blood, no fever.  Last antibiotic may have been Augmentin, from Dr. Kenton Kingfisher last winter.  We again discussed last CT scan consistent with aspirated material and airways.  She hears some "npose whistle" at night.  Question of ILD to be reassessed at follow-up HRCT in December, 2020.  02/07/2019-78 year old female never smoker followed for ILD,  chronic cough, chronic bronchitis, complicated by GERD/ probable aspiration, aortic aneurysm Anoro, neb  albuterol, Atrovent 0.06% nasal spray,  -----f/u for ILD; pt still has chronic cough; pt stopped taking Anoro d/t lethargy, still taking Proair and nebulizer Had flu vax She recently decided Anoro made her fatigued and says she feels better off it.  "Doing great". Some mild cough in the afternoons, much less than in past.  Not much aware of reflux while taking Protonix BID. UTD Prevnar 13 and Pneumovax 23.  Covid careful- discussed.  ROS-see HPI   + = positive Constitutional:   No-   weight loss, night sweats, fevers, chills, fatigue, lassitude. HEENT:   No-  headaches, difficulty swallowing, tooth/dental problems, sore throat,       No-  sneezing, itching, ear ache,                               +nasal congestion, post nasal drip,  CV:  No-   chest pain, orthopnea, PND, swelling in lower extremities, anasarca,                                             dizziness, palpitations Resp: No-   shortness of breath with exertion or at rest.               productive cough,   + cough,  No- coughing up of blood.              No-   change in color of mucus.  No- wheezing.   Skin: No-   rash or lesions. GI:  No-   heartburn, indigestion, abdominal pain, nausea, vomiting,  GU: . MS:   Neuro-  nothing unusual Psych:  No- change in mood or affect. No depression or anxiety.  No memory loss.  OBJ- Physical Exam General- Alert, Oriented, Affect-appropriate, Distress- none acute, + overweight Skin- rash-none, lesions- none, excoriation- none Lymphadenopathy- none Head- atraumatic            Eyes- Gross vision intact, PERRLA, conjunctivae and secretions clear            Ears- Hearing, canals-normal            Nose- Clear, no-Septal dev, mucus, polyps, erosion, perforation             Throat- Mallampati III , mucosa clear , drainage- none, tonsils- atrophic, hoarse + Neck- flexible , trachea midline, no stridor , thyroid nl, carotid no bruit Chest - symmetrical excursion , unlabored            Heart/CV- RRR , no murmur , no gallop  , no rub, nl s1 s2                           - JVD- none , edema- none, stasis changes- none, varices- none           Lung-  + +few minimal crackles, wheeze- none, cough- none, dullness-none, rub- none           Chest wall-  Abd-  Br/ Gen/ Rectal- Not done, not indicated Extrem- cyanosis- none, clubbing, none, atrophy- none, strength- nl Neuro- grossly intact to observation

## 2019-02-07 NOTE — Assessment & Plan Note (Signed)
Strongly suspect micro-aspiration. Reflux precautions and Protonix to continue. Plan- updated CT chest in December for 1 year comparison.

## 2019-02-07 NOTE — Assessment & Plan Note (Signed)
Interesting that she thinks Anoro made her fatigued. She is stable and improved, so we will see how she feels off it.

## 2019-02-28 ENCOUNTER — Other Ambulatory Visit: Payer: Self-pay | Admitting: Nurse Practitioner

## 2019-03-05 ENCOUNTER — Other Ambulatory Visit: Payer: Self-pay | Admitting: Nurse Practitioner

## 2019-03-17 ENCOUNTER — Other Ambulatory Visit: Payer: Self-pay

## 2019-03-17 ENCOUNTER — Ambulatory Visit (INDEPENDENT_AMBULATORY_CARE_PROVIDER_SITE_OTHER)
Admission: RE | Admit: 2019-03-17 | Discharge: 2019-03-17 | Disposition: A | Discharge status: Home / Self Care | Payer: Medicare Other | Source: Ambulatory Visit | Attending: Internal Medicine | Admitting: Internal Medicine

## 2019-03-17 DIAGNOSIS — J849 Interstitial pulmonary disease, unspecified: Secondary | ICD-10-CM

## 2019-03-18 ENCOUNTER — Telehealth: Payer: Self-pay

## 2019-03-18 DIAGNOSIS — R918 Other nonspecific abnormal finding of lung field: Secondary | ICD-10-CM

## 2019-03-18 NOTE — Telephone Encounter (Signed)
-----   Message from Deneise Lever, MD sent at 03/18/2019 10:39 AM EST ----- CT chest- There is debris in the right lung that again looks like aspiration from stomach. There appears to be a new nodule in the right lung. The radiologist recommends we repeat the CT scan in 3 months to make sure this is benign.  Order- schedule future CT chest with contrast (and BMET) in 3 months for dx right lung nodule.

## 2019-04-09 ENCOUNTER — Other Ambulatory Visit: Payer: Self-pay | Admitting: Family Medicine

## 2019-04-09 ENCOUNTER — Other Ambulatory Visit: Payer: Self-pay

## 2019-04-09 ENCOUNTER — Ambulatory Visit
Admission: RE | Admit: 2019-04-09 | Discharge: 2019-04-09 | Disposition: A | Discharge status: Home / Self Care | Payer: Medicare Other | Source: Ambulatory Visit | Attending: Family Medicine | Admitting: Family Medicine

## 2019-04-09 DIAGNOSIS — M25552 Pain in left hip: Secondary | ICD-10-CM

## 2019-04-22 ENCOUNTER — Telehealth: Payer: Self-pay | Admitting: Internal Medicine

## 2019-04-22 NOTE — Telephone Encounter (Signed)
Spoke with the pt  I advised we do rec that she get the covid vaccine and gave her the number to call [facility] [phone] Nothing further needed per pt

## 2019-06-17 ENCOUNTER — Telehealth: Payer: Self-pay | Admitting: Internal Medicine

## 2019-06-17 NOTE — Telephone Encounter (Signed)
I don't show any documentation where anyone from our office called her. Attempted to contact pt, I received a busy signal x2. Will try back.

## 2019-06-18 ENCOUNTER — Inpatient Hospital Stay: Admission: RE | Admit: 2019-06-18 | Payer: Medicare Other | Source: Ambulatory Visit

## 2019-06-18 ENCOUNTER — Telehealth: Payer: Self-pay | Admitting: Internal Medicine

## 2019-06-18 NOTE — Telephone Encounter (Signed)
LMTCB x1 for pt.  

## 2019-06-19 NOTE — Telephone Encounter (Signed)
Duplicate message. Will sign off.

## 2019-06-19 NOTE — Telephone Encounter (Signed)
Called and spoke to pt. Informed pt that there is nothing in her chart indicating we called her. Advised pt that it might have been another office. Pt verbalized understanding and denied any further questions or concerns at this time.

## 2019-08-08 ENCOUNTER — Other Ambulatory Visit: Payer: Self-pay

## 2019-08-08 ENCOUNTER — Encounter: Payer: Self-pay | Admitting: Internal Medicine

## 2019-08-08 ENCOUNTER — Ambulatory Visit: Payer: Medicare Other | Admitting: Internal Medicine

## 2019-08-08 VITALS — BP 128/80 | HR 80 | Temp 98.0°F | Ht 64.0 in | Wt 167.2 lb

## 2019-08-08 DIAGNOSIS — J42 Unspecified chronic bronchitis: Secondary | ICD-10-CM | POA: Diagnosis not present

## 2019-08-08 DIAGNOSIS — R05 Cough: Secondary | ICD-10-CM | POA: Diagnosis not present

## 2019-08-08 DIAGNOSIS — R9389 Abnormal findings on diagnostic imaging of other specified body structures: Secondary | ICD-10-CM | POA: Diagnosis not present

## 2019-08-08 DIAGNOSIS — R053 Chronic cough: Secondary | ICD-10-CM

## 2019-08-08 NOTE — Assessment & Plan Note (Signed)
Attributed to chronic bronchitis and microaspiration in this nonsmoker.

## 2019-08-08 NOTE — Assessment & Plan Note (Signed)
Encouraged to maintain reflux precautions and use her Flutter device to keep airways clear.

## 2019-08-08 NOTE — Assessment & Plan Note (Signed)
Plan- she agrees to schedule CT w contrast as planned

## 2019-08-08 NOTE — Patient Instructions (Addendum)
Order- CT chest with contrast    Dx abnormal CT 04/06/2019  Order- lab- BMET     Dx Chronic bronchitis  Please call if we can help

## 2019-08-08 NOTE — Progress Notes (Signed)
HPI  female never smoker followed for chronic cough, chronic bronchitis, complicated by GERD, ectatic aorta Allergy profile 03/17/2014-negative-total IgE only 3 with no specific elevations on the panel. CBC showed WBC 10,800, eosinophils 4%, PMN 69.3%  ENT dr  told possibly GERD was told Acid reflux and caffiene is causing this cough Office Spirometry 05/01/2016-WNL. FVC 2.57/90%, FEV1 101/94%, ratio 0.78, FEF 25-75% 1.97/120% PFT 03/21/18-WNL or minimal obstruction.  Normal diffusion.  FVC 2.58/90%, FEV1 1.90/88%, ratio 0.74, TLC 96%, DLCO 86% CT chest Hi Res 03/14/2018- 1. Patchy debris throughout the right lower lobe airways, presumably mucoid material. Aspiration is a consideration LAB- Respiratory Allergy Profile 11/27/17- neg for routine environmental allergens, with total IgE < 2 _____________________________________________-   02/07/2019-79 year old female never smoker followed for ILD,  chronic cough, chronic bronchitis, complicated by GERD/ probable aspiration, aortic aneurysm Anoro, neb albuterol, Atrovent 0.06% nasal spray,  -----f/u for ILD; pt still has chronic cough; pt stopped taking Anoro d/t lethargy, still taking Proair and nebulizer Had flu vax She recently decided Anoro made her fatigued and says she feels better off it.  "Doing great". Some mild cough in the afternoons, much less than in past.  Not much aware of reflux while taking Protonix BID. UTD Prevnar 13 and Pneumovax 23.  Covid careful- discussed.  08/08/19- 79 year old female never smoker followed for ILD,  chronic cough, chronic bronchitis, complicated by GERD/ probable aspiration, aortic aneurysm  neb albuterol, Atrovent 0.06% nasal spray, singulair, albuterol hfa, Protonix. -----ILD, Chronic Bronchitis, patient denies breathing issues Has had 2 Phizer Covax. Chronic cough continues to be better than in past years, and she is self-concious about it in [employer], but generally feels she is doing well.  No  blood, fever, chest pain or adenopathy. 49-month f/u CT ordered in December was to be rescheduled, but she never heard back. We discussed her CT from 12/ 7/20/ CT chest HR 03/17/2019- IMPRESSION: 1. New narrowing of, or debris impaction within, the right middle lobe bronchus. New central right middle lobe nodule. Follow-up CT chest with contrast in 3 months is recommended, as clinically indicated, as malignancy cannot be excluded. 2. Interval clearing of right lower lobe endobronchial debris. 3. No evidence of interstitial lung disease. 4. Additional scattered pulmonary nodules are unchanged and considered benign. 5. Aortic atherosclerosis (ICD10-170.0). Coronary artery calcification.   ROS-see HPI   + = positive Constitutional:   No-   weight loss, night sweats, fevers, chills, fatigue, lassitude. HEENT:   No-  headaches, difficulty swallowing, tooth/dental problems, sore throat,       No-  sneezing, itching, ear ache,                               +nasal congestion, post nasal drip,  CV:  No-   chest pain, orthopnea, PND, swelling in lower extremities, anasarca,                                             dizziness, palpitations Resp: No-   shortness of breath with exertion or at rest.               productive cough,   + cough,  No- coughing up of blood.              No-   change in color of mucus.  No- wheezing.   Skin: No-   rash or lesions. GI:  No-   heartburn, indigestion, abdominal pain, nausea, vomiting,  GU: . MS:   Neuro-     nothing unusual Psych:  No- change in mood or affect. No depression or anxiety.  No memory loss.  OBJ- Physical Exam General- Alert, Oriented, Affect-appropriate, Distress- none acute, + overweight Skin- rash-none, lesions- none, excoriation- none Lymphadenopathy- none Head- atraumatic            Eyes- Gross vision intact, PERRLA, conjunctivae and secretions clear            Ears- Hearing, canals-normal            Nose- Clear, no-Septal dev,  mucus, polyps, erosion, perforation             Throat- Mallampati III , mucosa clear , drainage- none, tonsils- atrophic, hoarse + Neck- flexible , trachea midline, no stridor , thyroid nl, carotid no bruit Chest - symmetrical excursion , unlabored           Heart/CV- RRR , no murmur , no gallop  , no rub, nl s1 s2                           - JVD- none , edema- none, stasis changes- none, varices- none           Lung-   +few minimal crackles, wheeze- none, cough+deep, dullness-none, rub- none           Chest wall-  Abd-  Br/ Gen/ Rectal- Not done, not indicated Extrem- cyanosis- none, clubbing, none, atrophy- none, strength- nl Neuro- grossly intact to observation

## 2019-08-15 ENCOUNTER — Other Ambulatory Visit (INDEPENDENT_AMBULATORY_CARE_PROVIDER_SITE_OTHER): Payer: Medicare Other

## 2019-08-15 DIAGNOSIS — J42 Unspecified chronic bronchitis: Secondary | ICD-10-CM | POA: Diagnosis not present

## 2019-08-15 LAB — BASIC METABOLIC PANEL
BUN: 8 mg/dL (ref 6–23)
CO2: 29 mEq/L (ref 19–32)
Calcium: 8.7 mg/dL (ref 8.4–10.5)
Chloride: 92 mEq/L — ABNORMAL LOW (ref 96–112)
Creatinine, Ser: 0.74 mg/dL (ref 0.40–1.20)
GFR: 75.63 mL/min (ref >60.00)
Glucose, Bld: 108 mg/dL — ABNORMAL HIGH (ref 70–99)
Potassium: 3.4 mEq/L — ABNORMAL LOW (ref 3.5–5.1)
Sodium: 126 mEq/L — ABNORMAL LOW (ref 135–145)

## 2019-08-18 ENCOUNTER — Telehealth: Payer: Self-pay | Admitting: Internal Medicine

## 2019-08-18 NOTE — Telephone Encounter (Signed)
Lab- sodium and potassium levels are low. Suggest eating a banana a day to raise potassium level, and try to eat a little more salt on food. You should discuss this with your primary physician.    Pt notified. Nothing further needed.

## 2019-08-18 NOTE — Telephone Encounter (Signed)
LMTC x 1  

## 2019-08-19 ENCOUNTER — Ambulatory Visit (INDEPENDENT_AMBULATORY_CARE_PROVIDER_SITE_OTHER)
Admission: RE | Admit: 2019-08-19 | Discharge: 2019-08-19 | Disposition: A | Discharge status: Home / Self Care | Payer: Medicare Other | Source: Ambulatory Visit | Attending: Internal Medicine | Admitting: Internal Medicine

## 2019-08-19 ENCOUNTER — Other Ambulatory Visit: Payer: Medicare Other

## 2019-08-19 ENCOUNTER — Other Ambulatory Visit: Payer: Self-pay

## 2019-08-19 DIAGNOSIS — R9389 Abnormal findings on diagnostic imaging of other specified body structures: Secondary | ICD-10-CM

## 2019-08-19 MED ORDER — IOHEXOL 300 MG/ML  SOLN
80.0000 mL | Freq: Once | INTRAMUSCULAR | Status: AC | PRN
  Administered 2019-08-19: 14:00:00 80 mL via INTRAVENOUS

## 2019-08-19 NOTE — Telephone Encounter (Signed)
ATC patient to make sure she got the information that she called about had to leave message. Looks like she talked to [place] yesterday around 5.   Lab- sodium and potassium levels are low. Suggest eating a banana a day to raise potassium level, and try to eat a little more salt on food. You should discuss this with your primary physician.    Pt notified. Nothing further needed.

## 2020-02-09 ENCOUNTER — Ambulatory Visit: Payer: Medicare Other | Admitting: Internal Medicine

## 2020-02-09 ENCOUNTER — Other Ambulatory Visit: Payer: Self-pay

## 2020-02-09 ENCOUNTER — Encounter: Payer: Self-pay | Admitting: Internal Medicine

## 2020-02-09 DIAGNOSIS — K219 Gastro-esophageal reflux disease without esophagitis: Secondary | ICD-10-CM | POA: Diagnosis not present

## 2020-02-09 DIAGNOSIS — J410 Simple chronic bronchitis: Secondary | ICD-10-CM | POA: Diagnosis not present

## 2020-02-09 DIAGNOSIS — J690 Pneumonitis due to inhalation of food and vomit: Secondary | ICD-10-CM | POA: Diagnosis not present

## 2020-02-09 MED ORDER — ALBUTEROL SULFATE HFA 108 (90 BASE) MCG/ACT IN AERS: INHALATION_SPRAY | RESPIRATORY_TRACT | 12 refills | Status: DC

## 2020-02-09 MED ORDER — ALBUTEROL SULFATE (2.5 MG/3ML) 0.083% IN NEBU: INHALATION_SOLUTION | RESPIRATORY_TRACT | 12 refills | Status: DC

## 2020-02-09 NOTE — Patient Instructions (Signed)
Glad you are doing better. Please call if we can help 

## 2020-02-09 NOTE — Progress Notes (Signed)
HPI  female never smoker followed for chronic cough, chronic bronchitis, complicated by GERD, ectatic aorta Allergy profile 03/17/2014-negative-total IgE only 3 with no specific elevations on the panel. CBC showed WBC 10,800, eosinophils 4%, PMN 69.3%  ENT dr  told possibly GERD was told Acid reflux and caffiene is causing this cough Office Spirometry 05/01/2016-WNL. FVC 2.57/90%, FEV1 101/94%, ratio 0.78, FEF 25-75% 1.97/120% PFT 03/21/18-WNL or minimal obstruction.  Normal diffusion.  FVC 2.58/90%, FEV1 1.90/88%, ratio 0.74, TLC 96%, DLCO 86% CT chest Hi Res 03/14/2018- 1. Patchy debris throughout the right lower lobe airways, presumably mucoid material. Aspiration is a consideration LAB- Respiratory Allergy Profile 11/27/17- neg for routine environmental allergens, with total IgE < 2 _____________________________________________-    08/08/19- 79 year old female never smoker followed for ILD,  chronic cough, chronic bronchitis, complicated by GERD/ probable aspiration, aortic aneurysm  neb albuterol, Atrovent 0.06% nasal spray, singulair, albuterol hfa, Protonix. -----ILD, Chronic Bronchitis, patient denies breathing issues Has had 2 Phizer Covax. Chronic cough continues to be better than in past years, and she is self-concious about it in [employer], but generally feels she is doing well.  No blood, fever, chest pain or adenopathy. 40-month f/u CT ordered in December was to be rescheduled, but she never heard back. We discussed her CT from 12/ 7/20/ CT chest HR 03/17/2019- IMPRESSION: 1. New narrowing of, or debris impaction within, the right middle lobe bronchus. New central right middle lobe nodule. Follow-up CT chest with contrast in 3 months is recommended, as clinically indicated, as malignancy cannot be excluded. 2. Interval clearing of right lower lobe endobronchial debris. 3. No evidence of interstitial lung disease. 4. Additional scattered pulmonary nodules are unchanged  and considered benign. 5. Aortic atherosclerosis (ICD10-170.0). Coronary artery Calcification.  02/09/20- 79 year old female never smoker followed for ILD,  chronic cough, chronic bronchitis, complicated by GERD/ probable aspiration, aortic aneurysm, CAD  Neb albuterol, Atrovent 0.06% nasal spray, singulair, albuterol hfa, Protonix. Covid vax- 2 Phizer Flu vax- had BMET 08/15/19- Na 126, K 3.4, Cl 51   Pat was notified at that time and directed to discuss with her PCP. We think RML pneumonia was likely aspiration- discussed.  Feels weak at times " old lady". CT chest 08/20/19- IMPRESSION: 1. No residual abnormality of the right middle lobe bronchus is seen, consistent with resolved inflammation. 2. Stable calcified granuloma in the right lower lobe. No new or enlarging pulmonary nodules. No acute chest findings. 3. Coronary andAortic Atherosclerosis (ICD10-I70.0).  ROS-see HPI   + = positive Constitutional:   No-   weight loss, night sweats, fevers, chills, fatigue, lassitude. HEENT:   No-  headaches, difficulty swallowing, tooth/dental problems, sore throat,       No-  sneezing, itching, ear ache,                               +nasal congestion, post nasal drip,  CV:  No-   chest pain, orthopnea, PND, swelling in lower extremities, anasarca,                                             dizziness, palpitations Resp: No-   shortness of breath with exertion or at rest.               productive cough,   + cough,  No- coughing  up of blood.              No-   change in color of mucus.  No- wheezing.   Skin: No-   rash or lesions. GI:  No-   heartburn, indigestion, abdominal pain, nausea, vomiting,  GU: . MS:   Neuro-     nothing unusual Psych:  No- change in mood or affect. No depression or anxiety.  No memory loss.  OBJ- Physical Exam General- Alert, Oriented, Affect-appropriate, Distress- none acute, + overweight Skin- rash-none, lesions- none, excoriation- none Lymphadenopathy-  none Head- atraumatic            Eyes- Gross vision intact, PERRLA, conjunctivae and secretions clear            Ears- Hearing, canals-normal            Nose- Clear, no-Septal dev, mucus, polyps, erosion, perforation             Throat- Mallampati III , mucosa clear , drainage- none, tonsils- atrophic, hoarse + Neck- flexible , trachea midline, no stridor , thyroid nl, carotid no bruit Chest - symmetrical excursion , unlabored           Heart/CV- RRR , no murmur , no gallop  , no rub, nl s1 s2                           - JVD- none , edema- none, stasis changes- none, varices- none           Lung-   +few minimal crackles, wheeze- none, cough-none, dullness-none, rub- none           Chest wall-  Abd-  Br/ Gen/ Rectal- Not done, not indicated Extrem- cyanosis- none, clubbing, none, atrophy- none, strength- nl Neuro- grossly intact to observation

## 2020-02-23 DIAGNOSIS — J690 Pneumonitis due to inhalation of food and vomit: Secondary | ICD-10-CM | POA: Insufficient documentation

## 2020-02-23 NOTE — Assessment & Plan Note (Signed)
Presumptive RML asp pneumonia, resolved as of CT 08/20/19

## 2020-02-23 NOTE — Assessment & Plan Note (Signed)
Emphasis on reflux and aspiration precautions.

## 2020-02-23 NOTE — Assessment & Plan Note (Signed)
Currently doing well Plan- refilled meds

## 2020-02-27 IMAGING — CT CT CHEST HIGH RESOLUTION W/O CM
2 of 6 series · 15 of 36 positions shown, 18 images · non-contrast
Comparison: 03/15/2018.

CLINICAL DATA: Abnormal CT 03/14/2018 with possible aspiration.

EXAM:
CT CHEST WITHOUT CONTRAST
TECHNIQUE: Multidetector CT imaging of the chest was performed following the
standard protocol without intravenous contrast. High resolution
imaging of the lungs, as well as inspiratory and expiratory imaging,
was performed.

[Series 6: high resolution · axial · 0.70mm/px · z∈[-262,-25]mm · 12 of 271 slices shown, 15 images]
[im 17/271  mediastinal]
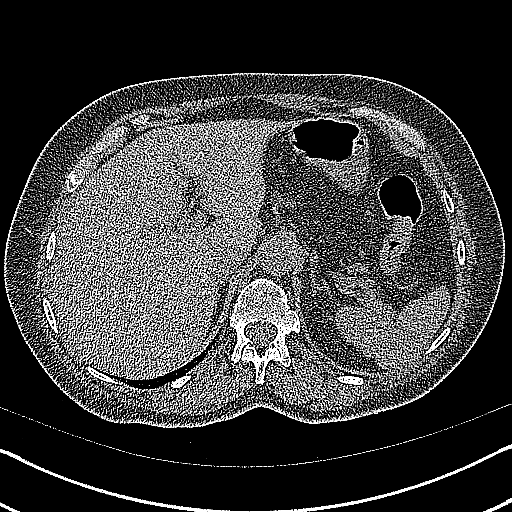
[im 17/271  lung]
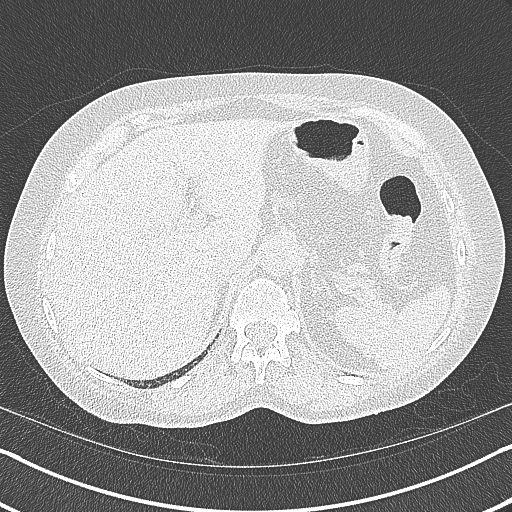
[im 34/271  lung]
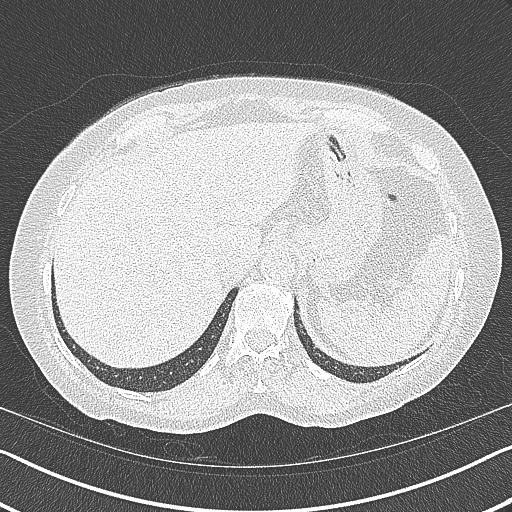
[im 68/271  lung]
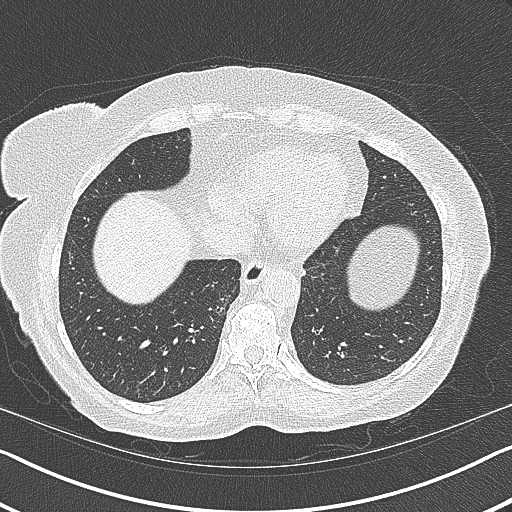
[im 85/271  lung]
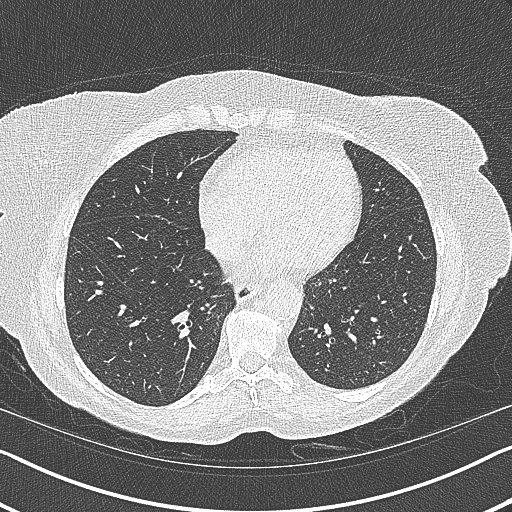
[im 102/271  mediastinal]
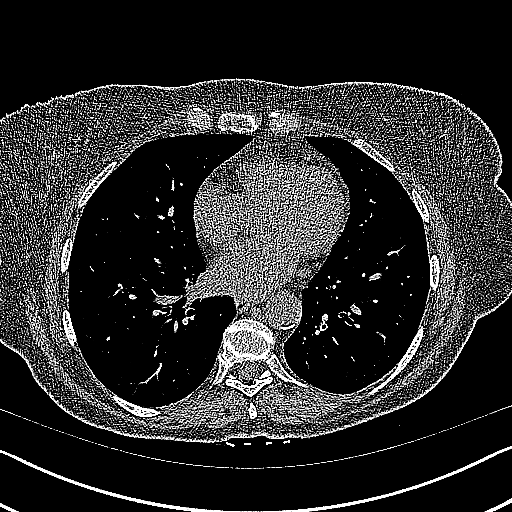
[im 102/271  lung]
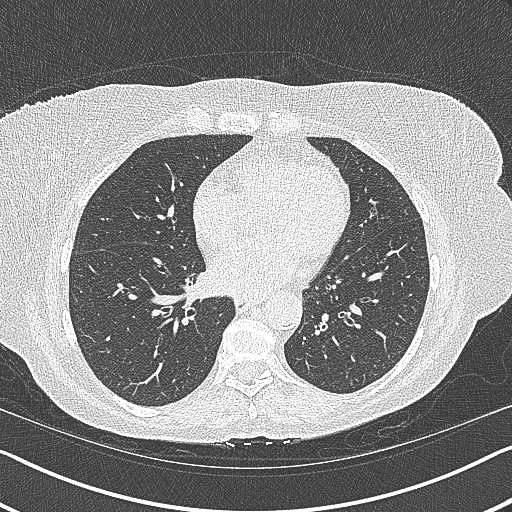
[im 119/271  lung]
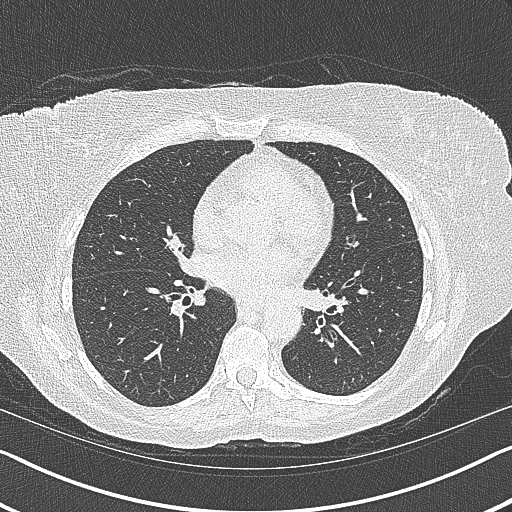
[im 152/271  lung]
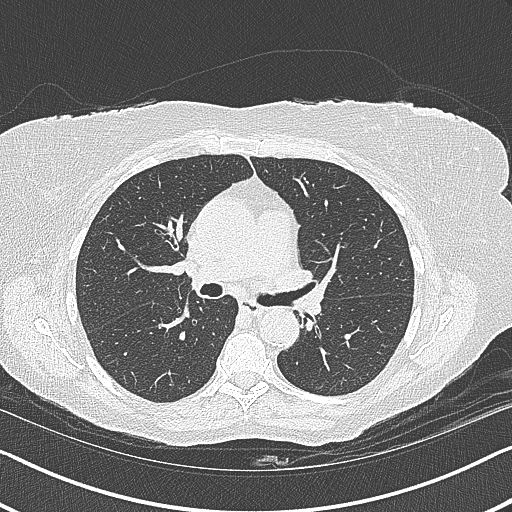
[im 169/271  lung]
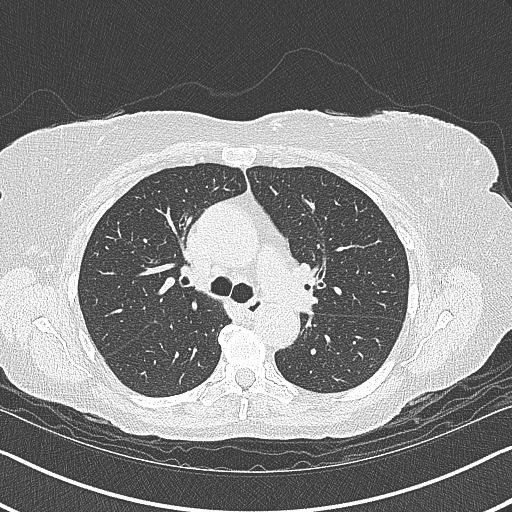
[im 186/271  mediastinal]
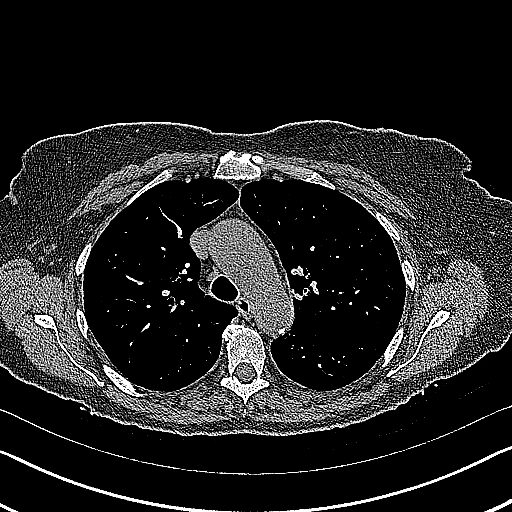
[im 186/271  lung]
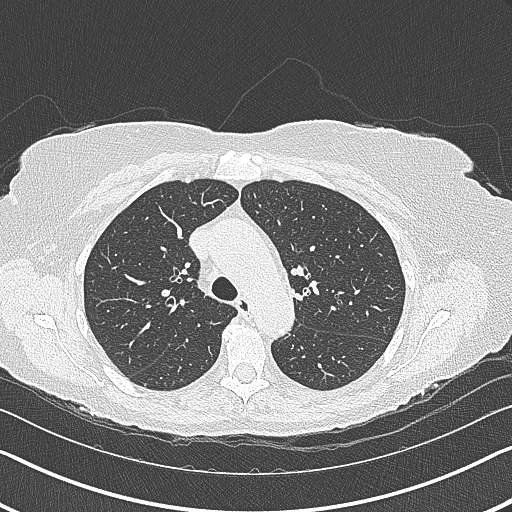
[im 203/271  lung]
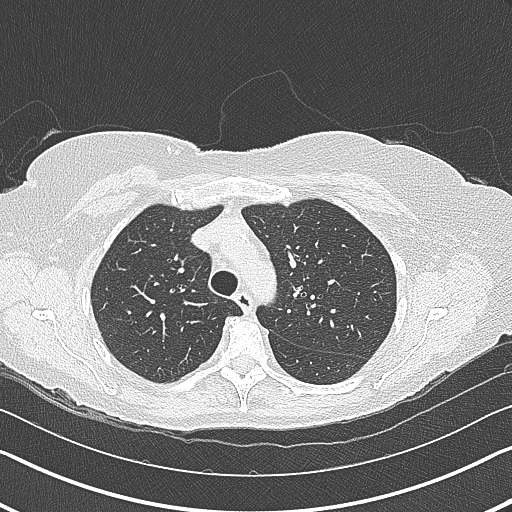
[im 237/271  lung]
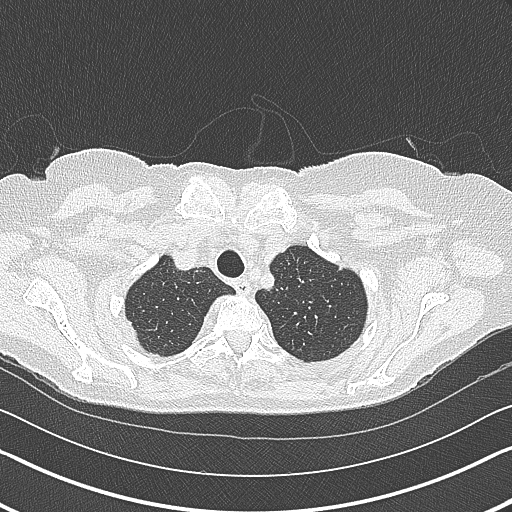
[im 254/271  lung]
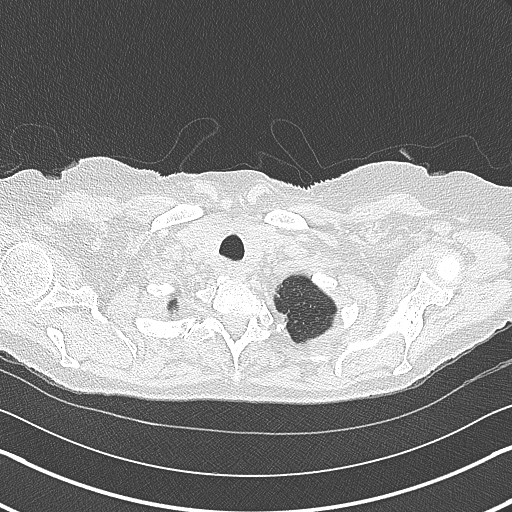

[Series 8: coronal · coronal · 0.60mm/px · 3 of 130 slices shown]
[im 26/130  lung]
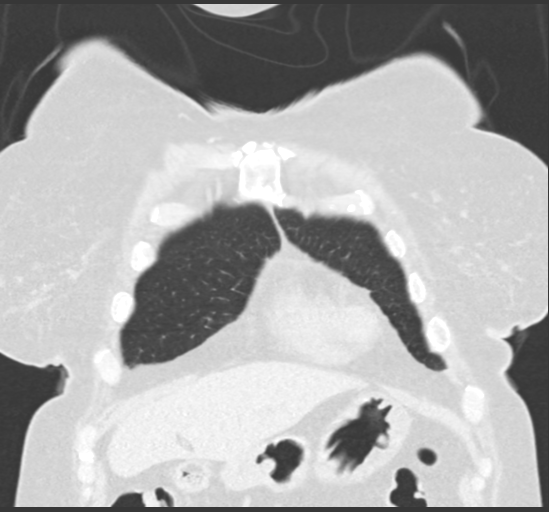
[im 52/130  lung]
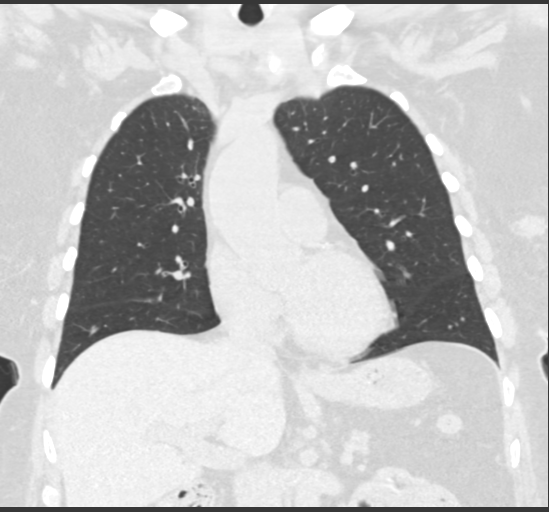
[im 78/130  lung]
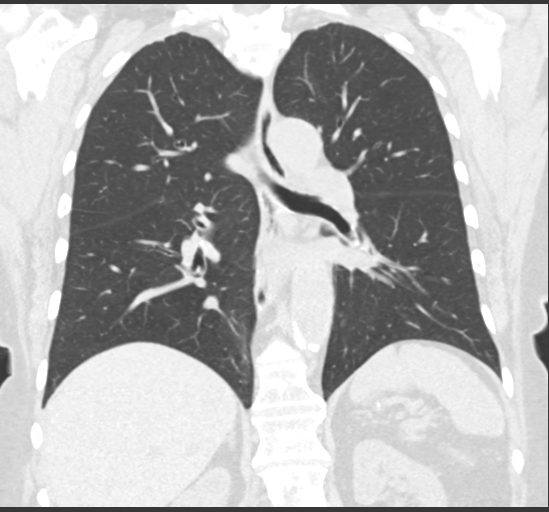

[15 of 36 positions shown; findings below may reference images not displayed]

FINDINGS: Cardiovascular: Atherosclerotic calcification of the aorta and
coronary arteries. Heart size normal. No pericardial effusion.

Mediastinum/Nodes: Mediastinal lymph nodes are not enlarged by CT
size criteria and appears similar to the prior exam. Hilar regions
are difficult to definitively evaluate without IV contrast. No
axillary adenopathy. Esophagus is grossly unremarkable.

Lungs/Pleura: Narrowing or debris impaction involving the right
middle lobe bronchus. Possible new central 5 mm right middle lobe
nodule (5/77). A few additional scattered pulmonary nodules measure
up to 4 mm in the right lower lobe, unchanged from 03/14/2018 and
considered benign. Interval clearing of right lower lobe
endobronchial debris. Minimal residual scattered mucoid impaction.
Negative for subpleural reticulation, traction
bronchiectasis/bronchiolectasis, architectural distortion,
ground-glass or honeycombing. No pleural fluid. Adherent debris in
the trachea.

Upper Abdomen: Visualized portions of the liver, adrenal glands,
kidneys, spleen, pancreas, stomach and bowel are unremarkable.
Cholecystectomy. No upper abdominal adenopathy.

Musculoskeletal: Degenerative changes in the spine. No worrisome
lytic or sclerotic lesions.
IMPRESSION: 1. New narrowing of, or debris impaction within, the right middle
lobe bronchus. New central right middle lobe nodule. Follow-up CT
chest with contrast in 3 months is recommended, as clinically
indicated, as malignancy cannot be excluded.
2. Interval clearing of right lower lobe endobronchial debris.
3. No evidence of interstitial lung disease.
4. Additional scattered pulmonary nodules are unchanged and
considered benign.
5. Aortic atherosclerosis (398YV-170.0). Coronary artery
calcification.

## 2020-03-25 ENCOUNTER — Other Ambulatory Visit: Payer: Self-pay | Admitting: Internal Medicine

## 2020-03-29 ENCOUNTER — Encounter: Payer: Self-pay | Admitting: Neurology

## 2020-04-27 DIAGNOSIS — J309 Allergic rhinitis, unspecified: Secondary | ICD-10-CM | POA: Diagnosis not present

## 2020-04-27 DIAGNOSIS — E039 Hypothyroidism, unspecified: Secondary | ICD-10-CM | POA: Diagnosis not present

## 2020-04-27 DIAGNOSIS — K219 Gastro-esophageal reflux disease without esophagitis: Secondary | ICD-10-CM | POA: Diagnosis not present

## 2020-04-27 DIAGNOSIS — Z0000 Encounter for general adult medical examination without abnormal findings: Secondary | ICD-10-CM | POA: Diagnosis not present

## 2020-04-27 DIAGNOSIS — J4520 Mild intermittent asthma, uncomplicated: Secondary | ICD-10-CM | POA: Diagnosis not present

## 2020-04-29 ENCOUNTER — Ambulatory Visit: Payer: Medicare Other | Admitting: Neurology

## 2020-05-17 DIAGNOSIS — E039 Hypothyroidism, unspecified: Secondary | ICD-10-CM | POA: Diagnosis not present

## 2020-05-17 DIAGNOSIS — M1990 Unspecified osteoarthritis, unspecified site: Secondary | ICD-10-CM | POA: Diagnosis not present

## 2020-05-17 DIAGNOSIS — K219 Gastro-esophageal reflux disease without esophagitis: Secondary | ICD-10-CM | POA: Diagnosis not present

## 2020-05-17 DIAGNOSIS — J4541 Moderate persistent asthma with (acute) exacerbation: Secondary | ICD-10-CM | POA: Diagnosis not present

## 2020-05-17 DIAGNOSIS — G4700 Insomnia, unspecified: Secondary | ICD-10-CM | POA: Diagnosis not present

## 2020-05-17 DIAGNOSIS — J4520 Mild intermittent asthma, uncomplicated: Secondary | ICD-10-CM | POA: Diagnosis not present

## 2020-05-17 DIAGNOSIS — M8580 Other specified disorders of bone density and structure, unspecified site: Secondary | ICD-10-CM | POA: Diagnosis not present

## 2020-05-17 DIAGNOSIS — I10 Essential (primary) hypertension: Secondary | ICD-10-CM | POA: Diagnosis not present

## 2020-05-17 DIAGNOSIS — E7800 Pure hypercholesterolemia, unspecified: Secondary | ICD-10-CM | POA: Diagnosis not present

## 2020-05-24 DIAGNOSIS — H16223 Keratoconjunctivitis sicca, not specified as Sjogren's, bilateral: Secondary | ICD-10-CM | POA: Diagnosis not present

## 2020-05-24 DIAGNOSIS — H40013 Open angle with borderline findings, low risk, bilateral: Secondary | ICD-10-CM | POA: Diagnosis not present

## 2020-05-24 DIAGNOSIS — H524 Presbyopia: Secondary | ICD-10-CM | POA: Diagnosis not present

## 2020-05-24 DIAGNOSIS — H0102B Squamous blepharitis left eye, upper and lower eyelids: Secondary | ICD-10-CM | POA: Diagnosis not present

## 2020-05-24 DIAGNOSIS — H0102A Squamous blepharitis right eye, upper and lower eyelids: Secondary | ICD-10-CM | POA: Diagnosis not present

## 2020-05-28 ENCOUNTER — Other Ambulatory Visit: Payer: Self-pay | Admitting: Family Medicine

## 2020-05-28 DIAGNOSIS — I739 Peripheral vascular disease, unspecified: Secondary | ICD-10-CM

## 2020-06-01 ENCOUNTER — Ambulatory Visit
Admission: RE | Admit: 2020-06-01 | Discharge: 2020-06-01 | Disposition: A | Discharge status: Home / Self Care | Payer: Medicare Other | Source: Ambulatory Visit | Attending: Family Medicine | Admitting: Family Medicine

## 2020-06-01 ENCOUNTER — Other Ambulatory Visit: Payer: Self-pay

## 2020-06-01 DIAGNOSIS — Z8679 Personal history of other diseases of the circulatory system: Secondary | ICD-10-CM | POA: Diagnosis not present

## 2020-06-01 DIAGNOSIS — I739 Peripheral vascular disease, unspecified: Secondary | ICD-10-CM

## 2020-06-29 DIAGNOSIS — E538 Deficiency of other specified B group vitamins: Secondary | ICD-10-CM | POA: Diagnosis not present

## 2020-06-29 DIAGNOSIS — Z13220 Encounter for screening for lipoid disorders: Secondary | ICD-10-CM | POA: Diagnosis not present

## 2020-06-29 DIAGNOSIS — Z136 Encounter for screening for cardiovascular disorders: Secondary | ICD-10-CM | POA: Diagnosis not present

## 2020-07-05 DIAGNOSIS — E7800 Pure hypercholesterolemia, unspecified: Secondary | ICD-10-CM | POA: Diagnosis not present

## 2020-07-05 DIAGNOSIS — J4541 Moderate persistent asthma with (acute) exacerbation: Secondary | ICD-10-CM | POA: Diagnosis not present

## 2020-07-05 DIAGNOSIS — G4700 Insomnia, unspecified: Secondary | ICD-10-CM | POA: Diagnosis not present

## 2020-07-05 DIAGNOSIS — M1990 Unspecified osteoarthritis, unspecified site: Secondary | ICD-10-CM | POA: Diagnosis not present

## 2020-07-05 DIAGNOSIS — J4520 Mild intermittent asthma, uncomplicated: Secondary | ICD-10-CM | POA: Diagnosis not present

## 2020-07-05 DIAGNOSIS — I10 Essential (primary) hypertension: Secondary | ICD-10-CM | POA: Diagnosis not present

## 2020-07-05 DIAGNOSIS — M8580 Other specified disorders of bone density and structure, unspecified site: Secondary | ICD-10-CM | POA: Diagnosis not present

## 2020-07-05 DIAGNOSIS — E039 Hypothyroidism, unspecified: Secondary | ICD-10-CM | POA: Diagnosis not present

## 2020-07-05 DIAGNOSIS — K219 Gastro-esophageal reflux disease without esophagitis: Secondary | ICD-10-CM | POA: Diagnosis not present

## 2020-07-15 ENCOUNTER — Ambulatory Visit: Payer: Medicare Other | Admitting: Neurology

## 2020-08-03 DIAGNOSIS — G4700 Insomnia, unspecified: Secondary | ICD-10-CM | POA: Diagnosis not present

## 2020-08-03 DIAGNOSIS — K219 Gastro-esophageal reflux disease without esophagitis: Secondary | ICD-10-CM | POA: Diagnosis not present

## 2020-08-03 DIAGNOSIS — E7800 Pure hypercholesterolemia, unspecified: Secondary | ICD-10-CM | POA: Diagnosis not present

## 2020-08-03 DIAGNOSIS — J4520 Mild intermittent asthma, uncomplicated: Secondary | ICD-10-CM | POA: Diagnosis not present

## 2020-08-03 DIAGNOSIS — I10 Essential (primary) hypertension: Secondary | ICD-10-CM | POA: Diagnosis not present

## 2020-08-03 DIAGNOSIS — M1990 Unspecified osteoarthritis, unspecified site: Secondary | ICD-10-CM | POA: Diagnosis not present

## 2020-08-03 DIAGNOSIS — E039 Hypothyroidism, unspecified: Secondary | ICD-10-CM | POA: Diagnosis not present

## 2020-08-03 DIAGNOSIS — J4541 Moderate persistent asthma with (acute) exacerbation: Secondary | ICD-10-CM | POA: Diagnosis not present

## 2020-08-03 DIAGNOSIS — M8580 Other specified disorders of bone density and structure, unspecified site: Secondary | ICD-10-CM | POA: Diagnosis not present

## 2020-08-06 NOTE — Progress Notes (Signed)
HPI  female never smoker followed for chronic cough, chronic bronchitis, complicated by GERD, ectatic aorta Allergy profile 03/17/2014-negative-total IgE only 3 with no specific elevations on the panel. CBC showed WBC 10,800, eosinophils 4%, PMN 69.3%  ENT dr  told possibly GERD was told Acid reflux and caffiene is causing this cough Office Spirometry 05/01/2016-WNL. FVC 2.57/90%, FEV1 101/94%, ratio 0.78, FEF 25-75% 1.97/120% PFT 03/21/18-WNL or minimal obstruction.  Normal diffusion.  FVC 2.58/90%, FEV1 1.90/88%, ratio 0.74, TLC 96%, DLCO 86% CT chest Hi Res 03/14/2018- 1. Patchy debris throughout the right lower lobe airways, presumably mucoid material. Aspiration is a consideration LAB- Respiratory Allergy Profile 11/27/17- neg for routine environmental allergens, with total IgE < 2 _____________________________________________-   02/09/20- 80 year old female never smoker followed for ILD,  chronic cough, chronic bronchitis, complicated by GERD/ probable aspiration, aortic aneurysm, CAD  Neb albuterol, Atrovent 0.06% nasal spray, singulair, albuterol hfa, Protonix. Covid vax- 2 Phizer Flu vax- had BMET 08/15/19- Na 126, K 3.4, Cl 88   Pat was notified at that time and directed to discuss with her PCP. We think RML pneumonia was likely aspiration- discussed.  Feels weak at times " old lady". CT chest 08/20/19- IMPRESSION: 1. No residual abnormality of the right middle lobe bronchus is seen, consistent with resolved inflammation. 2. Stable calcified granuloma in the right lower lobe. No new or enlarging pulmonary nodules. No acute chest findings. 3. Coronary andAortic Atherosclerosis (ICD10-I70.0).  08/09/20- 57 yoF female never smoker followed for  chronic Cough, chronic Bronchitis, complicated by GERD/ Probable Aspiration, aortic Aneurysm, CAD  -Neb albuterol, Atrovent 0.06% nasal spray, singulair, albuterol hfa, Protonix. Covid vax- 3 Phizer Flu vax- had -----Reports wheezing for past 2  days, still having intermittent cough that is sometimes productive with grayish sputum.   Denies fever, nodes, bloody or purulent sputum. Nonspecific, without sick exposure, "MIght" have refluxed.  Has amoxacillin at home.   ROS-see HPI   + = positive Constitutional:   No-   weight loss, night sweats, fevers, chills, fatigue, lassitude. HEENT:   No-  headaches, difficulty swallowing, tooth/dental problems, sore throat,       No-  sneezing, itching, ear ache,                               +nasal congestion, post nasal drip,  CV:  No-   chest pain, orthopnea, PND, swelling in lower extremities, anasarca,                                             dizziness, palpitations Resp: No-   shortness of breath with exertion or at rest.               +productive cough,   + cough,  No- coughing up of blood.              No-   change in color of mucus.  No- wheezing.   Skin: No-   rash or lesions. GI:  No-   heartburn, indigestion, abdominal pain, nausea, vomiting,  GU: . MS:   Neuro-     nothing unusual Psych:  No- change in mood or affect. No depression or anxiety.  No memory loss.  OBJ- Physical Exam General- Alert, Oriented, Affect-appropriate, Distress- none acute, + overweight Skin- rash-none, lesions- none, excoriation- none Lymphadenopathy- none  Head- atraumatic            Eyes- Gross vision intact, PERRLA, conjunctivae and secretions clear            Ears- Hearing, canals-normal            Nose- Clear, no-Septal dev, mucus, polyps, erosion, perforation             Throat- Mallampati III , mucosa clear , drainage- none, tonsils- atrophic, hoarse + Neck- flexible , trachea midline, no stridor , thyroid nl, carotid no bruit Chest - symmetrical excursion , unlabored           Heart/CV- RRR , no murmur , no gallop  , no rub, nl s1 s2                           - JVD- none , edema- none, stasis changes- none, varices- none           Lung-    wheeze- none, cough+raspy , dullness-none, rub-  none           Chest wall-  Abd-  Br/ Gen/ Rectal- Not done, not indicated Extrem- cyanosis- none, clubbing, none, atrophy- none, strength- nl Neuro- grossly intact to observation

## 2020-08-09 ENCOUNTER — Ambulatory Visit (INDEPENDENT_AMBULATORY_CARE_PROVIDER_SITE_OTHER): Payer: Medicare Other

## 2020-08-09 ENCOUNTER — Ambulatory Visit: Payer: Medicare Other | Admitting: Internal Medicine

## 2020-08-09 ENCOUNTER — Other Ambulatory Visit: Payer: Self-pay

## 2020-08-09 ENCOUNTER — Encounter: Payer: Self-pay | Admitting: Internal Medicine

## 2020-08-09 VITALS — BP 130/74 | HR 82 | Temp 98.2°F | Ht 65.5 in | Wt 167.4 lb

## 2020-08-09 DIAGNOSIS — K219 Gastro-esophageal reflux disease without esophagitis: Secondary | ICD-10-CM

## 2020-08-09 DIAGNOSIS — J42 Unspecified chronic bronchitis: Secondary | ICD-10-CM

## 2020-08-09 DIAGNOSIS — M5134 Other intervertebral disc degeneration, thoracic region: Secondary | ICD-10-CM | POA: Diagnosis not present

## 2020-08-09 NOTE — Assessment & Plan Note (Addendum)
Acute exacerbation - nonspecific but given past hx, possible reflux Plan- mucinex, continue meds, report if not improved in few days.

## 2020-08-09 NOTE — Assessment & Plan Note (Signed)
We still suspect occasional reflux event and emphasize reflux precautions

## 2020-08-09 NOTE — Patient Instructions (Signed)
Order- CXR    Dx exacerbation chronic bronchitis  Continue with your current medicines.  If this cough gets worse- sputum turns green or brown- then go ahead and start your amoxacillin  Please call if you have any questions

## 2020-08-24 DIAGNOSIS — K219 Gastro-esophageal reflux disease without esophagitis: Secondary | ICD-10-CM | POA: Diagnosis not present

## 2020-08-24 DIAGNOSIS — M8580 Other specified disorders of bone density and structure, unspecified site: Secondary | ICD-10-CM | POA: Diagnosis not present

## 2020-08-24 DIAGNOSIS — G4700 Insomnia, unspecified: Secondary | ICD-10-CM | POA: Diagnosis not present

## 2020-08-24 DIAGNOSIS — E7800 Pure hypercholesterolemia, unspecified: Secondary | ICD-10-CM | POA: Diagnosis not present

## 2020-08-24 DIAGNOSIS — I10 Essential (primary) hypertension: Secondary | ICD-10-CM | POA: Diagnosis not present

## 2020-08-24 DIAGNOSIS — J4520 Mild intermittent asthma, uncomplicated: Secondary | ICD-10-CM | POA: Diagnosis not present

## 2020-08-24 DIAGNOSIS — M1990 Unspecified osteoarthritis, unspecified site: Secondary | ICD-10-CM | POA: Diagnosis not present

## 2020-08-24 DIAGNOSIS — J4541 Moderate persistent asthma with (acute) exacerbation: Secondary | ICD-10-CM | POA: Diagnosis not present

## 2020-08-24 DIAGNOSIS — E039 Hypothyroidism, unspecified: Secondary | ICD-10-CM | POA: Diagnosis not present

## 2020-10-25 ENCOUNTER — Ambulatory Visit: Payer: Medicare Other | Admitting: Neurology

## 2020-10-25 ENCOUNTER — Encounter: Payer: Self-pay | Admitting: Neurology

## 2020-10-25 ENCOUNTER — Other Ambulatory Visit: Payer: Self-pay

## 2020-10-25 VITALS — BP 159/86 | HR 92 | Ht 65.0 in | Wt 170.2 lb

## 2020-10-25 DIAGNOSIS — R413 Other amnesia: Secondary | ICD-10-CM | POA: Diagnosis not present

## 2020-10-25 NOTE — Progress Notes (Signed)
NEUROLOGY CONSULTATION NOTE  Yolanda Hill MRN: [id number] DOB: 11-13-1940  Referring provider: Dr. Shirline Frees Primary care provider: Dr. Shirline Frees  Reason for consult:  memory loss  Dear Dr Kenton Kingfisher:  Thank you for your kind referral of Yolanda Hill for consultation of the above symptoms. Although her history is well known to you, please allow me to reiterate it for the purpose of our medical record. She is alone in the office today. Records and images were personally reviewed where available.   HISTORY OF PRESENT ILLNESS: This is a pleasant 80 year old right-handed woman with a history of hypertension, hyperlipidemia, hypothyroidism, neuropathy, presenting for evaluation of memory loss. She reported concerns to her PCP in 03/2020, she was forgetting conversations, what she ate earlier, having word-finding difficulties. It was noted her TSH was significantly abnormal due to missing her [place] thyroid, and that timing of memory loss lined up with thyroid dosing being off, however later on repeat TSH was normal and felt not to be the cause. B12 was low (176), she was started on B12 supplements. She states that her memory is "actually better than I thought it was." She feels it may have something to do with her hearing as well. Her son has been living with her for 10 years. She continues to drive short distances and denies getting lost. Her son drives her most of the time. Her son fixes her pillbox and she takes the medications by herself with no difficulties. She notes that she saw a pill on the floor recently but this is unusual. She continues to manage finances without issues and tries to do most on draft. Her son mostly cooks, she denies leaving the stove on. Her son has not mentioned any memory concerns. She is independent with dressing and bathing. No family history of dementia, significant head injuries. She occasionally drinks a cooler or glass of wine.  She has pain  going down the back of her head to the right side of her neck every now and then. She denies any dizziness, dysarthria/dysphagia, focal numbness/tingling/weakness, bladder dysfunction, tremors. She has occasional neck and left hip pain. She has occasional constipation. Her sense of smell has never been real good. She sleeps well with 1/2 Ambien and 1-2 tablets of gabapentin prn. Mood is good. No hallucinations.    PAST MEDICAL HISTORY: Past Medical History:  Diagnosis Date   Abdominal aortic atherosclerosis (HCC)    Allergic rhinitis    Anxiety    Arthritis    osteoarthritis, cervical and lumbar   Asthma    Diverticulitis    Eczema    GERD (gastroesophageal reflux disease)    Headache disorder 12/01/2014   Headache(784.0)    High blood pressure    High cholesterol    Hypothyroid    Idiopathic peripheral neuropathy    Insomnia    Interstitial lung disease ([place])    Meniere disease    Osteoarthritis    Osteopenia    Seasonal allergies    Vitamin D deficiency     PAST SURGICAL HISTORY: Past Surgical History:  Procedure Laterality Date   ABDOMINAL HYSTERECTOMY  1971   partial   APPENDECTOMY  1971   during hysterectomy   BRAVO [place] STUDY  09/29/2011   Procedure: BRAVO [place] STUDY;  Surgeon: Inda Castle, MD;  Location: [facility];  Service: Endoscopy;  Laterality: N/A;   CATARACT EXTRACTION     CHOLECYSTECTOMY     1989   ESOPHAGOGASTRODUODENOSCOPY  09/29/2011  Procedure: ESOPHAGOGASTRODUODENOSCOPY (EGD);  Surgeon: Inda Castle, MD;  Location: [facility];  Service: Endoscopy;  Laterality: N/A;  pt. to stay on [place]: Current Outpatient Medications on File Prior to Visit  Medication Sig Dispense Refill   acetaminophen (TYLENOL) 500 MG tablet Take 1,000 mg by mouth daily as needed.     albuterol (PROVENTIL) (2.5 MG/3ML) 0.083% nebulizer solution INHALE 1 VIAL VIA NEBULIZER EVERY 6 HOURS AS NEEDED FOR  WHEEZING/ SHORTNESS OF BREATH 75 mL 12   albuterol (VENTOLIN HFA) 108 (90 Base) MCG/ACT inhaler Inhale 2 puffs every 6 hours if needed 18 g 12   b complex vitamins tablet Take 1 tablet by mouth daily. (Patient not taking: Reported on 08/09/2020)     Calcium Carbonate-Vitamin D (CALCIUM-VITAMIN D) 500-200 MG-UNIT per tablet Take 1 tablet by mouth daily.     cetirizine (ZYRTEC) 10 MG tablet Take 1 tablet by mouth daily.     cholecalciferol (VITAMIN D) 1000 UNITS tablet Take 1,000 Units by mouth daily.     gabapentin (NEURONTIN) 300 MG capsule Take 1 capsule by mouth at bedtime.   11   LORazepam (ATIVAN) 0.5 MG tablet Take 0.5 mg by mouth every 6 (six) hours as needed for anxiety.     Magnesium 100 MG TABS Take by mouth.     montelukast (SINGULAIR) 10 MG tablet TAKE 1 TABLET(10 MG) BY MOUTH AT BEDTIME 30 tablet 11   Multiple Vitamin (MULTIVITAMIN) capsule Take 1 capsule by mouth daily.     pantoprazole (PROTONIX) 20 MG tablet Take 20 mg by mouth daily.     Potassium 75 MG TABS Take by mouth.     Respiratory Therapy Supplies (FLUTTER) DEVI Blow through 4 times per set, three sets daily 1 each 0   sodium chloride (OCEAN) 0.65 % SOLN nasal spray Place 1 spray into both nostrils as needed for congestion.     telmisartan-hydrochlorothiazide (MICARDIS HCT) 40-12.5 MG tablet TK 1 T PO QD  5   vitamin B-12 (CYANOCOBALAMIN) 500 MCG tablet Take 500 mcg by mouth daily.     zolpidem (AMBIEN) 10 MG tablet Take 5-10 mg by mouth At bedtime as needed for sleep.      No current facility-administered medications on file prior to visit.    ALLERGIES: Allergies  Allergen Reactions   Codeine Nausea Only   Dymista [Azelastine-Fluticasone]     Nasal burning   Simvastatin     Pt prefers not to take-personal reasonings    FAMILY HISTORY: Family History  Problem Relation Age of Onset   Alcohol abuse Father    Kidney disease Father    Heart disease Mother    Diabetes Sister    Stroke Sister    Depression  Sister    Diabetes Sister    Hypertension Sister    Emphysema Paternal Grandfather    COPD Paternal Grandfather    Heart attack Paternal Grandmother    Testicular cancer Grandchild    Colon cancer Neg Hx    Stomach cancer Neg Hx    Esophageal cancer Neg Hx     SOCIAL HISTORY: Social History   Socioeconomic History   Marital status: Widowed    Spouse name: Not on file   Number of children: 5   Years of education: 34   Highest education level: Not on file  Occupational History   Occupation: retired  Tobacco Use   Smoking status: Never  Smokeless tobacco: Never  Vaping Use   Vaping Use: Never used  Substance and Sexual Activity   Alcohol use: Yes    Comment: rarely   Drug use: No   Sexual activity: Not on file  Other Topics Concern   Not on file  Social History Narrative   Patient drinks caffeine occasionally.   Patient is right handed.   Social Determinants of Health   Financial Resource Strain: Not on file  Food Insecurity: Not on file  Transportation Needs: Not on file  Physical Activity: Not on file  Stress: Not on file  Social Connections: Not on file  Intimate Partner Violence: Not on file     PHYSICAL EXAM: Vitals:   10/25/20 1410  BP: (!) 159/86  Pulse: 92  SpO2: 98%   General: No acute distress Head:  Normocephalic/atraumatic Skin/Extremities: No rash, no edema Neurological Exam: Mental status: alert and oriented to person, place, and time, no dysarthria or aphasia, Fund of knowledge is appropriate.  Remote memory intact.  Attention and concentration are normal.    Able to name objects and repeat phrases. Difficulty with [place] test and delayed recall. [place] 20/30 Montreal Cognitive Assessment  10/25/2020  Visuospatial/ Executive (0/5) 2  Naming (0/3) 2  Attention: Read list of digits (0/2) 2  Attention: Read list of letters (0/1) 1  Attention: Serial 7 subtraction starting at 100 (0/3) 2  Language: Repeat phrase (0/2) 1  Language :  Fluency (0/1) 1  Abstraction (0/2) 0  Delayed Recall (0/5) 2  Orientation (0/6) 6  Total 19  Adjusted Score (based on education) 20    Cranial nerves: CN I: not tested CN II: pupils equal, round and reactive to light, visual fields intact CN III, IV, VI:  full range of motion, no nystagmus, no ptosis CN V: facial sensation intact CN VII: upper and lower face symmetric CN VIII: hearing intact to conversation CN IX, X: gag intact, uvula midline CN XI: sternocleidomastoid and trapezius muscles intact CN XII: tongue midline Bulk & Tone: normal, no fasciculations. Motor: 5/5 throughout with no pronator drift. Sensation: intact to light touch, cold, pin, vibration sense.  No extinction to double simultaneous stimulation.  Romberg test negative Deep Tendon Reflexes: +2 throughout, negative Hoffman sign Cerebellar: no incoordination on finger to nose testing Gait: narrow-based and steady, no ataxia Tremor: none   IMPRESSION: This is a pleasant 80 year old right-handed woman with a history of hypertension, hyperlipidemia, hypothyroidism, neuropathy, presenting for evaluation of memory loss. Her neurological exam is non-focal, MOCA score today 20/30. She denies any significant difficulties with complex tasks although she is alone with no family to corroborate history. We discussed different causes of memory loss, including age-related memory loss. MRI brain without contrast will be ordered to assess for underlying structural abnormality. Continue B12 supplements. No indication to start cholinesterase inhibitors at this time. We discussed the importance of control of vascular risk factors, physical exercise, brain stimulation exercises and MIND diet for overall brain health. Continue to monitor driving. May consider referral for Neurocognitive testing on next visit. Follow-up in 6 months, call for any changes.    Thank you for allowing me to participate in the care of this patient. Please do not  hesitate to call for any questions or concerns.   Ellouise Newer, M.D.  CC: Dr. Kenton Kingfisher

## 2020-10-25 NOTE — Patient Instructions (Signed)
Good to meet you!  Schedule MRI brain without contrast  2. Follow-up in 6 months, call for any changes   FALL PRECAUTIONS: Be cautious when walking. Scan the area for obstacles that may increase the risk of trips and falls. When getting up in the mornings, sit up at the edge of the bed for a few minutes before getting out of bed. Consider elevating the bed at the head end to avoid drop of blood pressure when getting up. Walk always in a well-lit room (use night lights in the walls). Avoid area rugs or power cords from appliances in the middle of the walkways. Use a walker or a cane if necessary and consider physical therapy for balance exercise. Get your eyesight checked regularly.  FINANCIAL OVERSIGHT: Supervision, especially oversight when making financial decisions or transactions is also recommended as difficulties arise.  HOME SAFETY: Consider the safety of the kitchen when operating appliances like stoves, microwave oven, and blender. Consider having supervision and share cooking responsibilities until no longer able to participate in those. Accidents with firearms and other hazards in the house should be identified and addressed as well.  DRIVING: Regarding driving, in patients with progressive memory problems, driving will be impaired. We advise to have someone else do the driving if trouble finding directions or if minor accidents are reported. Independent driving assessment is available to determine safety of driving.  ABILITY TO BE LEFT ALONE: If patient is unable to contact 911 operator, consider using LifeLine, or when the need is there, arrange for someone to stay with patients. Smoking is a fire hazard, consider supervision or cessation. Risk of wandering should be assessed by caregiver and if detected at any point, supervision and safe proof recommendations should be instituted.  MEDICATION SUPERVISION: Inability to self-administer medication needs to be constantly addressed.  Implement a mechanism to ensure safe administration of the medications.  RECOMMENDATIONS FOR ALL PATIENTS WITH MEMORY PROBLEMS: 1. Continue to exercise (Recommend 30 minutes of walking everyday, or 3 hours every week) 2. Increase social interactions - continue going to [place] and enjoy social gatherings with friends and family 3. Eat healthy, avoid fried foods and eat more fruits and vegetables 4. Maintain adequate blood pressure, blood sugar, and blood cholesterol level. Reducing the risk of stroke and cardiovascular disease also helps promoting better memory. 5. Avoid stressful situations. Live a simple life and avoid aggravations. Organize your time and prepare for the next day in anticipation. 6. Sleep well, avoid any interruptions of sleep and avoid any distractions in the bedroom that may interfere with adequate sleep quality 7. Avoid sugar, avoid sweets as there is a strong link between excessive sugar intake, diabetes, and cognitive impairment We discussed the Mediterranean diet, which has been shown to help patients reduce the risk of progressive memory disorders and reduces cardiovascular risk. This includes eating fish, eat fruits and green leafy vegetables, nuts like almonds and hazelnuts, walnuts, and also use olive oil. Avoid fast foods and fried foods as much as possible. Avoid sweets and sugar as sugar use has been linked to worsening of memory function.       Mediterranean Diet  Why follow it? Research shows. Those who follow the Mediterranean diet have a reduced risk of heart disease  The diet is associated with a reduced incidence of Parkinson's and Alzheimer's diseases People following the diet may have longer life expectancies and lower rates of chronic diseases  The Dietary Guidelines for Americans recommends the Mediterranean diet as an eating plan  to promote health and prevent disease  What Is the Mediterranean Diet?  Healthy eating plan based on typical foods and  recipes of Mediterranean-style cooking The diet is primarily a plant based diet; these foods should make up a majority of meals   Starches - Plant based foods should make up a majority of meals - They are an important sources of vitamins, minerals, energy, antioxidants, and fiber - Choose whole grains, foods high in fiber and minimally processed items  - Typical grain sources include wheat, oats, barley, corn, brown rice, bulgar, farro, millet, polenta, couscous  - Various types of beans include chickpeas, lentils, fava beans, black beans, white beans   Fruits  Veggies - Large quantities of antioxidant rich fruits & veggies; 6 or more servings  - Vegetables can be eaten raw or lightly drizzled with oil and cooked  - Vegetables common to the traditional Mediterranean Diet include: artichokes, arugula, beets, broccoli, brussel sprouts, cabbage, carrots, celery, collard greens, cucumbers, eggplant, kale, leeks, lemons, lettuce, mushrooms, okra, onions, peas, peppers, potatoes, pumpkin, radishes, rutabaga, shallots, spinach, sweet potatoes, turnips, zucchini - Fruits common to the Mediterranean Diet include: apples, apricots, avocados, cherries, clementines, dates, figs, grapefruits, grapes, melons, nectarines, oranges, peaches, pears, pomegranates, strawberries, tangerines  Fats - Replace butter and margarine with healthy oils, such as olive oil, canola oil, and tahini  - Limit nuts to no more than a handful a day  - Nuts include walnuts, almonds, pecans, pistachios, pine nuts  - Limit or avoid candied, honey roasted or heavily salted nuts - Olives are central to the [employer] - can be eaten whole or used in a variety of dishes   Meats Protein - Limiting red meat: no more than a few times a month - When eating red meat: choose lean cuts and keep the portion to the size of deck of cards - Eggs: approx. 0 to 4 times a week  - Fish and lean poultry: at least 2 a week  - Healthy protein  sources include, chicken, [place], lean beef, lamb - Increase intake of seafood such as tuna, salmon, trout, mackerel, shrimp, scallops - Avoid or limit high fat processed meats such as sausage and bacon  Dairy - Include moderate amounts of low fat dairy products  - Focus on healthy dairy such as fat free yogurt, skim milk, low or reduced fat cheese - Limit dairy products higher in fat such as whole or 2% milk, cheese, ice cream  Alcohol - Moderate amounts of red wine is ok  - No more than 5 oz daily for women (all ages) and men older than age 75  - No more than 10 oz of wine daily for men younger than 88  Other - Limit sweets and other desserts  - Use herbs and spices instead of salt to flavor foods  - Herbs and spices common to the traditional Mediterranean Diet include: basil, bay leaves, chives, cloves, cumin, fennel, garlic, lavender, marjoram, mint, oregano, parsley, pepper, rosemary, sage, savory, sumac, tarragon, thyme   It's not just a diet, it's a lifestyle:  The Mediterranean diet includes lifestyle factors typical of those in the region  Foods, drinks and meals are best eaten with others and savored Daily physical activity is important for overall good health This could be strenuous exercise like running and aerobics This could also be more leisurely activities such as walking, housework, yard-work, or taking the stairs Moderation is the key; a balanced and healthy diet accommodates most foods and  drinks Consider portion sizes and frequency of consumption of certain foods   Meal Ideas & Options:  Breakfast:  Whole wheat toast or whole wheat English muffins with peanut butter & hard boiled egg Steel cut oats topped with apples & cinnamon and skim milk  Fresh fruit: banana, strawberries, melon, berries, peaches  Smoothies: strawberries, bananas, greek yogurt, peanut butter Low fat greek yogurt with blueberries and granola  Egg white omelet with spinach and mushrooms Breakfast  couscous: whole wheat couscous, apricots, skim milk, cranberries  Sandwiches:  Hummus and grilled vegetables (peppers, zucchini, squash) on whole wheat bread   Grilled chicken on whole wheat pita with lettuce, tomatoes, cucumbers or tzatziki  [place] salad on whole wheat bread: tuna salad made with greek yogurt, olives, red peppers, capers, green onions Garlic rosemary lamb pita: lamb sauted with garlic, rosemary, salt & pepper; add lettuce, cucumber, greek yogurt to pita - flavor with lemon juice and black pepper  Seafood:  Mediterranean grilled salmon, seasoned with garlic, basil, parsley, lemon juice and black pepper Shrimp, lemon, and spinach whole-grain pasta salad made with low fat greek yogurt  Seared scallops with lemon orzo  Seared tuna steaks seasoned salt, pepper, coriander topped with tomato mixture of olives, tomatoes, olive oil, minced garlic, parsley, green onions and cappers  Meats:  Herbed greek chicken salad with kalamata olives, cucumber, feta  Red bell peppers stuffed with spinach, bulgur, lean ground beef (or lentils) & topped with feta   Kebabs: skewers of chicken, tomatoes, onions, zucchini, squash  [place] burgers: made with red onions, mint, dill, lemon juice, feta cheese topped with roasted red peppers Vegetarian Cucumber salad: cucumbers, artichoke hearts, celery, red onion, feta cheese, tossed in olive oil & lemon juice  Hummus and whole grain pita points with a greek salad (lettuce, tomato, feta, olives, cucumbers, red onion) Lentil soup with celery, carrots made with vegetable broth, garlic, salt and pepper  Tabouli salad: parsley, bulgur, mint, scallions, cucumbers, tomato, radishes, lemon juice, olive oil, salt and pepper.

## 2020-11-05 DIAGNOSIS — M8580 Other specified disorders of bone density and structure, unspecified site: Secondary | ICD-10-CM | POA: Diagnosis not present

## 2020-11-05 DIAGNOSIS — M1990 Unspecified osteoarthritis, unspecified site: Secondary | ICD-10-CM | POA: Diagnosis not present

## 2020-11-05 DIAGNOSIS — E7800 Pure hypercholesterolemia, unspecified: Secondary | ICD-10-CM | POA: Diagnosis not present

## 2020-11-05 DIAGNOSIS — I10 Essential (primary) hypertension: Secondary | ICD-10-CM | POA: Diagnosis not present

## 2020-11-05 DIAGNOSIS — J4541 Moderate persistent asthma with (acute) exacerbation: Secondary | ICD-10-CM | POA: Diagnosis not present

## 2020-11-05 DIAGNOSIS — J4520 Mild intermittent asthma, uncomplicated: Secondary | ICD-10-CM | POA: Diagnosis not present

## 2020-11-05 DIAGNOSIS — K219 Gastro-esophageal reflux disease without esophagitis: Secondary | ICD-10-CM | POA: Diagnosis not present

## 2020-11-05 DIAGNOSIS — G4700 Insomnia, unspecified: Secondary | ICD-10-CM | POA: Diagnosis not present

## 2020-11-05 DIAGNOSIS — E039 Hypothyroidism, unspecified: Secondary | ICD-10-CM | POA: Diagnosis not present

## 2020-11-06 ENCOUNTER — Ambulatory Visit
Admission: RE | Admit: 2020-11-06 | Discharge: 2020-11-06 | Disposition: A | Discharge status: Home / Self Care | Payer: Medicare Other | Source: Ambulatory Visit | Attending: Neurology | Admitting: Neurology

## 2020-11-06 ENCOUNTER — Other Ambulatory Visit: Payer: Self-pay

## 2020-11-06 DIAGNOSIS — I6381 Other cerebral infarction due to occlusion or stenosis of small artery: Secondary | ICD-10-CM | POA: Diagnosis not present

## 2020-11-06 DIAGNOSIS — R413 Other amnesia: Secondary | ICD-10-CM | POA: Diagnosis not present

## 2020-11-06 DIAGNOSIS — G319 Degenerative disease of nervous system, unspecified: Secondary | ICD-10-CM | POA: Diagnosis not present

## 2020-11-06 DIAGNOSIS — J329 Chronic sinusitis, unspecified: Secondary | ICD-10-CM | POA: Diagnosis not present

## 2020-11-10 ENCOUNTER — Telehealth: Payer: Self-pay | Admitting: Neurology

## 2020-11-10 NOTE — Telephone Encounter (Signed)
Pt would like a call back to discuss her MRI, please call  [phone]

## 2020-11-11 NOTE — Telephone Encounter (Signed)
Patient would like a call back about the MRI results please call

## 2020-11-11 NOTE — Telephone Encounter (Signed)
Pls let her know brain MRI did not show any tumor, bleed, or new stroke. There was hardening of the small blood vessels in the brain and old small strokes that are seen in patients with blood pressure and cholesterol issues, so it is very important to continue control of these and make sure she does not forget her medications.

## 2020-11-11 NOTE — Telephone Encounter (Signed)
Spoke with pt son informed him brain MRI did not show any tumor, bleed, or new stroke. There was hardening of the small blood vessels in the brain and old small strokes that are seen in patients with blood pressure and cholesterol issues, so it is very important to continue control of these and make sure she does not forget her medications

## 2020-11-17 DIAGNOSIS — I679 Cerebrovascular disease, unspecified: Secondary | ICD-10-CM | POA: Diagnosis not present

## 2020-11-17 DIAGNOSIS — R413 Other amnesia: Secondary | ICD-10-CM | POA: Diagnosis not present

## 2020-11-17 DIAGNOSIS — E7800 Pure hypercholesterolemia, unspecified: Secondary | ICD-10-CM | POA: Diagnosis not present

## 2020-11-17 DIAGNOSIS — E039 Hypothyroidism, unspecified: Secondary | ICD-10-CM | POA: Diagnosis not present

## 2020-11-17 DIAGNOSIS — I10 Essential (primary) hypertension: Secondary | ICD-10-CM | POA: Diagnosis not present

## 2020-11-29 DIAGNOSIS — E7800 Pure hypercholesterolemia, unspecified: Secondary | ICD-10-CM | POA: Diagnosis not present

## 2020-11-29 DIAGNOSIS — M8580 Other specified disorders of bone density and structure, unspecified site: Secondary | ICD-10-CM | POA: Diagnosis not present

## 2020-11-29 DIAGNOSIS — I10 Essential (primary) hypertension: Secondary | ICD-10-CM | POA: Diagnosis not present

## 2020-11-29 DIAGNOSIS — G4700 Insomnia, unspecified: Secondary | ICD-10-CM | POA: Diagnosis not present

## 2020-11-29 DIAGNOSIS — J4520 Mild intermittent asthma, uncomplicated: Secondary | ICD-10-CM | POA: Diagnosis not present

## 2020-11-29 DIAGNOSIS — K219 Gastro-esophageal reflux disease without esophagitis: Secondary | ICD-10-CM | POA: Diagnosis not present

## 2020-11-29 DIAGNOSIS — J4541 Moderate persistent asthma with (acute) exacerbation: Secondary | ICD-10-CM | POA: Diagnosis not present

## 2020-11-29 DIAGNOSIS — E039 Hypothyroidism, unspecified: Secondary | ICD-10-CM | POA: Diagnosis not present

## 2020-11-29 DIAGNOSIS — M1990 Unspecified osteoarthritis, unspecified site: Secondary | ICD-10-CM | POA: Diagnosis not present

## 2020-12-02 DIAGNOSIS — H40013 Open angle with borderline findings, low risk, bilateral: Secondary | ICD-10-CM | POA: Diagnosis not present

## 2020-12-22 ENCOUNTER — Ambulatory Visit: Payer: Medicare Other | Admitting: Podiatry

## 2020-12-27 ENCOUNTER — Ambulatory Visit (INDEPENDENT_AMBULATORY_CARE_PROVIDER_SITE_OTHER): Payer: Medicare Other | Admitting: Podiatry

## 2020-12-27 ENCOUNTER — Encounter: Payer: Self-pay | Admitting: Podiatry

## 2020-12-27 ENCOUNTER — Other Ambulatory Visit: Payer: Self-pay

## 2020-12-27 DIAGNOSIS — L600 Ingrowing nail: Secondary | ICD-10-CM | POA: Diagnosis not present

## 2020-12-27 NOTE — Patient Instructions (Signed)

## 2020-12-27 NOTE — Progress Notes (Signed)
Subjective:   Patient ID: Yolanda Hill, female   DOB: 80 y.o.   MRN: [id number]   HPI Patient presents with severe nail disease of the big nail second nail left in the big nail right that are thickened dystrophic and she cannot cut.  States they have increasingly become hard for her to wear shoe gear with that she is looking for a definitive solution.  Patient does not smoke and does like to be active if possible   Review of Systems  All other systems reviewed and are negative.      Objective:  Physical Exam Vitals and nursing note reviewed.  Constitutional:      Appearance: She is well-developed.  Pulmonary:     Effort: Pulmonary effort is normal.  Musculoskeletal:        General: Normal range of motion.  Skin:    General: Skin is warm.  Neurological:     Mental Status: She is alert.    Neurovascular status was found to be intact muscle strength was found to be adequate range of motion adequate.  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  She is opted for permanent solution I recommended nail removal and organ to do all 3 at the same time.  I explained procedure risk she wants surgery signed consent form and today I infiltrated each of the toes 60 mg Xylocaine Marcaine mixture I remove the hallux nail right hallux nail left second nail left exposed matrix and applied phenol 5 applications 30 seconds to each nail bed followed by alcohol lavage sterile dressing gave instructions on soaks and wearing wider shoes.  Reappoint for [place] to recheck encouraged to call questions concerns and leave dressing on 24 hours but take it off  earlier if any throbbing were to occur

## 2021-02-02 DIAGNOSIS — E039 Hypothyroidism, unspecified: Secondary | ICD-10-CM | POA: Diagnosis not present

## 2021-02-02 DIAGNOSIS — E7800 Pure hypercholesterolemia, unspecified: Secondary | ICD-10-CM | POA: Diagnosis not present

## 2021-02-02 DIAGNOSIS — M1990 Unspecified osteoarthritis, unspecified site: Secondary | ICD-10-CM | POA: Diagnosis not present

## 2021-02-02 DIAGNOSIS — M8580 Other specified disorders of bone density and structure, unspecified site: Secondary | ICD-10-CM | POA: Diagnosis not present

## 2021-02-02 DIAGNOSIS — J4520 Mild intermittent asthma, uncomplicated: Secondary | ICD-10-CM | POA: Diagnosis not present

## 2021-02-02 DIAGNOSIS — I10 Essential (primary) hypertension: Secondary | ICD-10-CM | POA: Diagnosis not present

## 2021-02-02 DIAGNOSIS — J4541 Moderate persistent asthma with (acute) exacerbation: Secondary | ICD-10-CM | POA: Diagnosis not present

## 2021-02-02 DIAGNOSIS — G4700 Insomnia, unspecified: Secondary | ICD-10-CM | POA: Diagnosis not present

## 2021-02-02 DIAGNOSIS — K219 Gastro-esophageal reflux disease without esophagitis: Secondary | ICD-10-CM | POA: Diagnosis not present

## 2021-02-23 DIAGNOSIS — E7800 Pure hypercholesterolemia, unspecified: Secondary | ICD-10-CM | POA: Diagnosis not present

## 2021-02-23 DIAGNOSIS — J849 Interstitial pulmonary disease, unspecified: Secondary | ICD-10-CM | POA: Diagnosis not present

## 2021-02-23 DIAGNOSIS — F5101 Primary insomnia: Secondary | ICD-10-CM | POA: Diagnosis not present

## 2021-02-23 DIAGNOSIS — I10 Essential (primary) hypertension: Secondary | ICD-10-CM | POA: Diagnosis not present

## 2021-02-23 DIAGNOSIS — E039 Hypothyroidism, unspecified: Secondary | ICD-10-CM | POA: Diagnosis not present

## 2021-02-23 DIAGNOSIS — I700 Atherosclerosis of aorta: Secondary | ICD-10-CM | POA: Diagnosis not present

## 2021-02-23 DIAGNOSIS — K219 Gastro-esophageal reflux disease without esophagitis: Secondary | ICD-10-CM | POA: Diagnosis not present

## 2021-02-23 DIAGNOSIS — I679 Cerebrovascular disease, unspecified: Secondary | ICD-10-CM | POA: Diagnosis not present

## 2021-03-14 ENCOUNTER — Other Ambulatory Visit: Payer: Self-pay | Admitting: Internal Medicine

## 2021-03-16 NOTE — Telephone Encounter (Signed)
Montelukast refilled

## 2021-03-26 ENCOUNTER — Other Ambulatory Visit: Payer: Self-pay | Admitting: Internal Medicine

## 2021-03-26 DIAGNOSIS — J410 Simple chronic bronchitis: Secondary | ICD-10-CM

## 2021-04-08 ENCOUNTER — Telehealth: Payer: Self-pay | Admitting: Internal Medicine

## 2021-04-08 MED ORDER — DOXYCYCLINE HYCLATE 100 MG PO TABS: 100.0000 mg | ORAL_TABLET | Freq: Two times a day (BID) | ORAL | 0 refills | Status: DC

## 2021-04-08 MED ORDER — BENZONATATE 200 MG PO CAPS: 200.0000 mg | ORAL_CAPSULE | Freq: Three times a day (TID) | ORAL | 2 refills | Status: DC | PRN

## 2021-04-08 NOTE — Telephone Encounter (Signed)
Called and spoke with pt letting her know recs per CY and she verbalized understanding. Meds have been sent to preferred pharmacy for pt. Nothing further needed.

## 2021-04-08 NOTE — Telephone Encounter (Signed)
Please offer doxycycline 100 mg, # 20, 1 twice daily                      Benzonatate perles 200 mg, # 50,  ref x 2    1 every 8 hours if needed for cough

## 2021-04-08 NOTE — Telephone Encounter (Signed)
I called the patient and she reports that she has had a non productive cough that has been worse in the last month. She has noticed a little congestion but no other symptoms at this time. She has been on OTC Delsym with no relief. She reports the cough is keeping her up at night. She had a negative covid test yesterday. She has been using her nebulizer some and her flutter valve. Please advise.

## 2021-04-28 ENCOUNTER — Other Ambulatory Visit: Payer: Self-pay

## 2021-04-28 ENCOUNTER — Ambulatory Visit: Payer: Medicare Other | Admitting: Physician Assistant

## 2021-04-28 ENCOUNTER — Encounter: Payer: Self-pay | Admitting: Physician Assistant

## 2021-04-28 VITALS — BP 146/74 | HR 74 | Resp 18 | Ht 65.0 in | Wt 174.0 lb

## 2021-04-28 DIAGNOSIS — G3184 Mild cognitive impairment, so stated: Secondary | ICD-10-CM | POA: Diagnosis not present

## 2021-04-28 DIAGNOSIS — R413 Other amnesia: Secondary | ICD-10-CM | POA: Diagnosis not present

## 2021-04-28 NOTE — Progress Notes (Signed)
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  There is no indication of worsening of the memory.   Recommendations:   Discussed safety both in and out of the home.  Discussed the importance of regular daily schedule with inclusion of crossword puzzles to maintain brain function.  Continue to monitor mood by PCP Stay active at least 30 minutes at least 3 times a week.  Naps should be scheduled and should be no longer than 60 minutes and should not occur after 2 PM.  Mediterranean diet is recommended  Control cardiovascular risk factors  Follow up in 6  months.    Subjective:    Yolanda Hill is a very pleasant 81 y.o. RH female  seen today in follow up for memory loss. This patient is here alone.  Previous records as well as any outside records available were reviewed prior to todays visit.  Patient was last seen at our office on 10/25/2020 at which time her MoCA was 20/30.  She is not on any antidementia medications.  She reports her memory being good, occasionally she becomes aggravated when she is talking and she is thinking of the word and "have to think about it harder ".  She denies repeating the same stories and asking the same questions, or becoming confused when coming to the room.  She denies losing objects.  Her son who lives with her, does most of the driving.  She only drives very short distances to the pharmacy or to the supermarket.  She denies any depression, although the thought of the grandchildren bring her to fight her tears as she cannot see that often.  However she denies any new mood changes.  She likes to do crossword puzzles and word finding.  She sleeps well with gabapentin and Ambien.  She denies vivid dreams or sleepwalking, hallucinations or paranoia.  There are no hygiene concerns.  Her son fixes the medication pillbox.  She does the finances, and is proud to follow-up on "a few pennies here and they are, especially with the cable bill  ".  Appetite is good, denies trouble swallowing.  She cooks mostly with her son.  She ambulates without difficulty, denies any falls or head injuries.  She denies headaches, double vision, dizziness, or focal numbness.  She has peripheral neuropathy, which is well treated with gabapentin, with improvement of the symptoms.  She denies any unilateral weakness or tremors.  She was having a "bad sense of smell".  There is no history of seizures.  Denies any urine incontinence, retention, constipation or diarrhea.    Initial visit 10/25/20 "This is a pleasant 81 year old right-handed woman with a history of hypertension, hyperlipidemia, hypothyroidism, neuropathy, presenting for evaluation of memory loss. She reported concerns to her PCP in 03/2020, she was forgetting conversations, what she ate earlier, having word-finding difficulties. It was noted her TSH was significantly abnormal due to missing her [place] thyroid, and that timing of memory loss lined up with thyroid dosing being off, however later on repeat TSH was normal and felt not to be the cause. B12 was low (176), she was started on B12 supplements. She states that her memory is "actually better than I thought it was." She feels it may have something to do with her hearing as well. Her son has been living with her for 10 years. She continues to drive short distances and denies getting lost. Her son drives her most of the time. Her son fixes  her pillbox and she takes the medications by herself with no difficulties. She notes that she saw a pill on the floor recently but this is unusual. She continues to manage finances without issues and tries to do most on draft. Her son mostly cooks, she denies leaving the stove on. Her son has not mentioned any memory concerns. She is independent with dressing and bathing. No family history of dementia, significant head injuries. She occasionally drinks a cooler or glass of wine.   She has pain going down the back of her  head to the right side of her neck every now and then. She denies any dizziness, dysarthria/dysphagia, focal numbness/tingling/weakness, bladder dysfunction, tremors. She has occasional neck and left hip pain. She has occasional constipation. Her sense of smell has never been real good. She sleeps well with 1/2 Ambien and 1-2 tablets of gabapentin prn. Mood is good. No hallucinations."  MRI brain wo contrast 11/06/20 No evidence of acute intracranial abnormality.   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 Background moderate chronic small vessel ischemic changes within the cerebral white matter. Mild chronic small vessel ischemic changes within the pons. These findings have also progressed from the prior MRI. Redemonstrated small chronic infarct within the right cerebellar hemisphere. Mild generalized cerebral and cerebellar atrophy.  Mild paranasal sinus disease at the imaged levels, as described.  Right mastoid effusion.    PREVIOUS MEDICATIONS:   CURRENT MEDICATIONS:  Outpatient Encounter Medications as of 04/28/2021  Medication Sig   acetaminophen (TYLENOL) 500 MG tablet Take 1,000 mg by mouth daily as needed.   albuterol (PROVENTIL) (2.5 MG/3ML) 0.083% nebulizer solution USE 1 VIAL VIA NEBULIZER EVERY 6 HOURS AS NEEDED FOR WHEEZING OR SHORTNESS OF BREATH   albuterol (VENTOLIN HFA) 108 (90 Base) MCG/ACT inhaler Inhale 2 puffs every 6 hours if needed   b complex vitamins tablet Take 1 tablet by mouth daily.   benzonatate (TESSALON) 200 MG capsule Take 1 capsule (200 mg total) by mouth every 8 (eight) hours as needed for cough.   Calcium Carbonate-Vitamin D (CALCIUM-VITAMIN D) 500-200 MG-UNIT per tablet Take 1 tablet by mouth daily.   cetirizine (ZYRTEC) 10 MG tablet Take 1 tablet by mouth daily.   cholecalciferol (VITAMIN D) 1000 UNITS tablet Take 1,000 Units by mouth daily.    doxycycline (VIBRA-TABS) 100 MG tablet Take 1 tablet (100 mg total) by mouth 2 (two) times daily.   gabapentin (NEURONTIN) 300 MG capsule Take 1 capsule by mouth at bedtime.    LORazepam (ATIVAN) 0.5 MG tablet Take 0.5 mg by mouth every 6 (six) hours as needed for anxiety.   Magnesium 100 MG TABS Take by mouth.   montelukast (SINGULAIR) 10 MG tablet TAKE 1 TABLET(10 MG) BY MOUTH AT BEDTIME   Multiple Vitamin (MULTIVITAMIN) capsule Take 1 capsule by mouth daily.   pantoprazole (PROTONIX) 20 MG tablet Take 20 mg by mouth daily.   Potassium 75 MG TABS Take by mouth.   Respiratory Therapy Supplies (FLUTTER) DEVI Blow through 4 times per set, three sets daily   sodium chloride (OCEAN) 0.65 % SOLN nasal spray Place 1 spray into both nostrils as needed for congestion.   telmisartan-hydrochlorothiazide (MICARDIS HCT) 40-12.5 MG tablet TK 1 T PO QD   vitamin B-12 (CYANOCOBALAMIN) 500 MCG tablet Take 500 mcg by mouth daily.   zolpidem (AMBIEN) 10 MG tablet Take 5-10 mg by mouth At bedtime as needed for  sleep.    No facility-administered encounter medications on file as of 04/28/2021.     Objective:     PHYSICAL EXAMINATION:    VITALS:   Vitals:   04/28/21 1302  BP: (!) 146/74  Pulse: 74  Resp: 18  SpO2: 100%  Weight: 174 lb (78.9 kg)  Height: 5\' 5"  (1.651 m)    GEN:  The patient appears stated age and is in NAD. HEENT:  Normocephalic, atraumatic.   Neurological examination:  General: NAD, well-groomed, appears stated age. Orientation: The patient is alert. Oriented to person, place and date Cranial nerves: There is good facial symmetry.The speech is fluent and clear. No aphasia or dysarthria. Fund of knowledge is appropriate. Recent and remote memory are impaired. Attention and concentration are reduced.  Able to name objects and repeat phrases.  Hearing is intact to conversational tone.    Sensation: Sensation is intact to light touch throughout Motor: Strength is at least  antigravity x4. Tremors: none  DTR's 2/4 in [place] Cognitive Assessment  10/25/2020  Visuospatial/ Executive (0/5) 2  Naming (0/3) 2  Attention: Read list of digits (0/2) 2  Attention: Read list of letters (0/1) 1  Attention: Serial 7 subtraction starting at 100 (0/3) 2  Language: Repeat phrase (0/2) 1  Language : Fluency (0/1) 1  Abstraction (0/2) 0  Delayed Recall (0/5) 2  Orientation (0/6) 6  Total 19  Adjusted Score (based on education) 20   MMSE - Mini Mental State Exam 04/28/2021  Orientation to time 5  Orientation to Place 5  Registration 3  Attention/ Calculation 5  Recall 2  Language- name 2 objects 2  Language- repeat 1  Language- follow 3 step command 3  Language- read & follow direction 1  Write a sentence 1  Copy design 1  Total score 29    No flowsheet data found.     Movement examination: Tone: There is normal tone in the UE/LE Abnormal movements:  no tremor.  No myoclonus.  No asterixis.   Coordination:  There is no decremation with RAM's. Normal finger to nose  Gait and Station: The patient has no difficulty arising out of a deep-seated chair without the use of the hands. The patient's stride length is good.  Gait is cautious and narrow.        Total time spent on today's visit was  30 minutes, including both face-to-face time and nonface-to-face time. Time included that spent on review of records (prior notes available to me/labs/imaging if pertinent), discussing treatment and goals, answering patient's questions and coordinating care.  Cc:  Shirline Frees, MD Sharene Butters, PA-C

## 2021-04-28 NOTE — Patient Instructions (Addendum)
Good to meet you!  2. Follow-up in 6 months, call for any changes   FALL PRECAUTIONS: Be cautious when walking. Scan the area for obstacles that may increase the risk of trips and falls. When getting up in the mornings, sit up at the edge of the bed for a few minutes before getting out of bed. Consider elevating the bed at the head end to avoid drop of blood pressure when getting up. Walk always in a well-lit room (use night lights in the walls). Avoid area rugs or power cords from appliances in the middle of the walkways. Use a walker or a cane if necessary and consider physical therapy for balance exercise. Get your eyesight checked regularly.  FINANCIAL OVERSIGHT: Supervision, especially oversight when making financial decisions or transactions is also recommended as difficulties arise.  HOME SAFETY: Consider the safety of the kitchen when operating appliances like stoves, microwave oven, and blender. Consider having supervision and share cooking responsibilities until no longer able to participate in those. Accidents with firearms and other hazards in the house should be identified and addressed as well.  DRIVING: Regarding driving, in patients with progressive memory problems, driving will be impaired. We advise to have someone else do the driving if trouble finding directions or if minor accidents are reported. Independent driving assessment is available to determine safety of driving.  ABILITY TO BE LEFT ALONE: If patient is unable to contact 911 operator, consider using LifeLine, or when the need is there, arrange for someone to stay with patients. Smoking is a fire hazard, consider supervision or cessation. Risk of wandering should be assessed by caregiver and if detected at any point, supervision and safe proof recommendations should be instituted.  MEDICATION SUPERVISION: Inability to self-administer medication needs to be constantly addressed. Implement a mechanism to ensure safe  administration of the medications.  RECOMMENDATIONS FOR ALL PATIENTS WITH MEMORY PROBLEMS: 1. Continue to exercise (Recommend 30 minutes of walking everyday, or 3 hours every week) 2. Increase social interactions - continue going to [place] and enjoy social gatherings with friends and family 3. Eat healthy, avoid fried foods and eat more fruits and vegetables 4. Maintain adequate blood pressure, blood sugar, and blood cholesterol level. Reducing the risk of stroke and cardiovascular disease also helps promoting better memory. 5. Avoid stressful situations. Live a simple life and avoid aggravations. Organize your time and prepare for the next day in anticipation. 6. Sleep well, avoid any interruptions of sleep and avoid any distractions in the bedroom that may interfere with adequate sleep quality 7. Avoid sugar, avoid sweets as there is a strong link between excessive sugar intake, diabetes, and cognitive impairment We discussed the Mediterranean diet, which has been shown to help patients reduce the risk of progressive memory disorders and reduces cardiovascular risk. This includes eating fish, eat fruits and green leafy vegetables, nuts like almonds and hazelnuts, walnuts, and also use olive oil. Avoid fast foods and fried foods as much as possible. Avoid sweets and sugar as sugar use has been linked to worsening of memory function.       Mediterranean Diet  Why follow it? Research shows Those who follow the Mediterranean diet have a reduced risk of heart disease  The diet is associated with a reduced incidence of Parkinson's and Alzheimer's diseases People following the diet may have longer life expectancies and lower rates of chronic diseases  The Dietary Guidelines for Americans recommends the Mediterranean diet as an eating plan to promote health and prevent disease  What Is the Mediterranean Diet?  Healthy eating plan based on typical foods and recipes of Mediterranean-style cooking The  diet is primarily a plant based diet; these foods should make up a majority of meals   Starches - Plant based foods should make up a majority of meals - They are an important sources of vitamins, minerals, energy, antioxidants, and fiber - Choose whole grains, foods high in fiber and minimally processed items  - Typical grain sources include wheat, oats, barley, corn, brown rice, bulgar, farro, millet, polenta, couscous  - Various types of beans include chickpeas, lentils, fava beans, black beans, white beans   Fruits  Veggies - Large quantities of antioxidant rich fruits & veggies; 6 or more servings  - Vegetables can be eaten raw or lightly drizzled with oil and cooked  - Vegetables common to the traditional Mediterranean Diet include: artichokes, arugula, beets, broccoli, brussel sprouts, cabbage, carrots, celery, collard greens, cucumbers, eggplant, kale, leeks, lemons, lettuce, mushrooms, okra, onions, peas, peppers, potatoes, pumpkin, radishes, rutabaga, shallots, spinach, sweet potatoes, turnips, zucchini - Fruits common to the Mediterranean Diet include: apples, apricots, avocados, cherries, clementines, dates, figs, grapefruits, grapes, melons, nectarines, oranges, peaches, pears, pomegranates, strawberries, tangerines  Fats - Replace butter and margarine with healthy oils, such as olive oil, canola oil, and tahini  - Limit nuts to no more than a handful a day  - Nuts include walnuts, almonds, pecans, pistachios, pine nuts  - Limit or avoid candied, honey roasted or heavily salted nuts - Olives are central to the [employer] - can be eaten whole or used in a variety of dishes   Meats Protein - Limiting red meat: no more than a few times a month - When eating red meat: choose lean cuts and keep the portion to the size of deck of cards - Eggs: approx. 0 to 4 times a week  - Fish and lean poultry: at least 2 a week  - Healthy protein sources include, chicken, [place], lean beef,  lamb - Increase intake of seafood such as tuna, salmon, trout, mackerel, shrimp, scallops - Avoid or limit high fat processed meats such as sausage and bacon  Dairy - Include moderate amounts of low fat dairy products  - Focus on healthy dairy such as fat free yogurt, skim milk, low or reduced fat cheese - Limit dairy products higher in fat such as whole or 2% milk, cheese, ice cream  Alcohol - Moderate amounts of red wine is ok  - No more than 5 oz daily for women (all ages) and men older than age 63  - No more than 10 oz of wine daily for men younger than 30  Other - Limit sweets and other desserts  - Use herbs and spices instead of salt to flavor foods  - Herbs and spices common to the traditional Mediterranean Diet include: basil, bay leaves, chives, cloves, cumin, fennel, garlic, lavender, marjoram, mint, oregano, parsley, pepper, rosemary, sage, savory, sumac, tarragon, thyme   Its not just a diet, its a lifestyle:  The Mediterranean diet includes lifestyle factors typical of those in the region  Foods, drinks and meals are best eaten with others and savored Daily physical activity is important for overall good health This could be strenuous exercise like running and aerobics This could also be more leisurely activities such as walking, housework, yard-work, or taking the stairs Moderation is the key; a balanced and healthy diet accommodates most foods and drinks Consider portion sizes and frequency of  consumption of certain foods   Meal Ideas & Options:  Breakfast:  Whole wheat toast or whole wheat English muffins with peanut butter & hard boiled egg Steel cut oats topped with apples & cinnamon and skim milk  Fresh fruit: banana, strawberries, melon, berries, peaches  Smoothies: strawberries, bananas, greek yogurt, peanut butter Low fat greek yogurt with blueberries and granola  Egg white omelet with spinach and mushrooms Breakfast couscous: whole wheat couscous, apricots, skim  milk, cranberries  Sandwiches:  Hummus and grilled vegetables (peppers, zucchini, squash) on whole wheat bread   Grilled chicken on whole wheat pita with lettuce, tomatoes, cucumbers or tzatziki  [place] salad on whole wheat bread: tuna salad made with greek yogurt, olives, red peppers, capers, green onions Garlic rosemary lamb pita: lamb sauted with garlic, rosemary, salt & pepper; add lettuce, cucumber, greek yogurt to pita - flavor with lemon juice and black pepper  Seafood:  Mediterranean grilled salmon, seasoned with garlic, basil, parsley, lemon juice and black pepper Shrimp, lemon, and spinach whole-grain pasta salad made with low fat greek yogurt  Seared scallops with lemon orzo  Seared tuna steaks seasoned salt, pepper, coriander topped with tomato mixture of olives, tomatoes, olive oil, minced garlic, parsley, green onions and cappers  Meats:  Herbed greek chicken salad with kalamata olives, cucumber, feta  Red bell peppers stuffed with spinach, bulgur, lean ground beef (or lentils) & topped with feta   Kebabs: skewers of chicken, tomatoes, onions, zucchini, squash  [place] burgers: made with red onions, mint, dill, lemon juice, feta cheese topped with roasted red peppers Vegetarian Cucumber salad: cucumbers, artichoke hearts, celery, red onion, feta cheese, tossed in olive oil & lemon juice  Hummus and whole grain pita points with a greek salad (lettuce, tomato, feta, olives, cucumbers, red onion) Lentil soup with celery, carrots made with vegetable broth, garlic, salt and pepper  Tabouli salad: parsley, bulgur, mint, scallions, cucumbers, tomato, radishes, lemon juice, olive oil, salt and pepper.

## 2021-05-04 ENCOUNTER — Telehealth: Payer: Self-pay | Admitting: Internal Medicine

## 2021-05-04 MED ORDER — ALBUTEROL SULFATE HFA 108 (90 BASE) MCG/ACT IN AERS: INHALATION_SPRAY | RESPIRATORY_TRACT | 12 refills | Status: DC

## 2021-05-04 NOTE — Telephone Encounter (Signed)
Rx for pt's rescue inhaler has been sent to preferred pharmacy for pt. Called and spoke with pt letting her know this had been done and she verbalized understanding. Nothing further needed. ?

## 2021-06-02 DIAGNOSIS — H40013 Open angle with borderline findings, low risk, bilateral: Secondary | ICD-10-CM | POA: Diagnosis not present

## 2021-06-02 DIAGNOSIS — H0102A Squamous blepharitis right eye, upper and lower eyelids: Secondary | ICD-10-CM | POA: Diagnosis not present

## 2021-06-02 DIAGNOSIS — H0102B Squamous blepharitis left eye, upper and lower eyelids: Secondary | ICD-10-CM | POA: Diagnosis not present

## 2021-06-02 DIAGNOSIS — H16223 Keratoconjunctivitis sicca, not specified as Sjogren's, bilateral: Secondary | ICD-10-CM | POA: Diagnosis not present

## 2021-06-28 DIAGNOSIS — R0789 Other chest pain: Secondary | ICD-10-CM | POA: Diagnosis not present

## 2021-06-30 ENCOUNTER — Ambulatory Visit
Admission: RE | Admit: 2021-06-30 | Discharge: 2021-06-30 | Disposition: A | Discharge status: Home / Self Care | Payer: Medicare Other | Source: Ambulatory Visit | Attending: Family Medicine | Admitting: Family Medicine

## 2021-06-30 ENCOUNTER — Other Ambulatory Visit: Payer: Self-pay | Admitting: Family Medicine

## 2021-06-30 ENCOUNTER — Other Ambulatory Visit: Payer: Self-pay

## 2021-06-30 DIAGNOSIS — I700 Atherosclerosis of aorta: Secondary | ICD-10-CM | POA: Diagnosis not present

## 2021-06-30 DIAGNOSIS — R079 Chest pain, unspecified: Secondary | ICD-10-CM | POA: Diagnosis not present

## 2021-08-10 NOTE — Progress Notes (Signed)
HPI  female never smoker followed for chronic cough, chronic bronchitis, complicated by GERD, ectatic aorta Allergy profile 03/17/2014-negative-total IgE only 3 with no specific elevations on the panel. CBC showed WBC 10,800, eosinophils 4%, PMN 69.3%  ENT dr  told possibly GERD was told Acid reflux and caffiene is causing this cough Office Spirometry 05/01/2016-WNL. FVC 2.57/90%, FEV1 101/94%, ratio 0.78, FEF 25-75% 1.97/120% PFT 03/21/18-WNL or minimal obstruction.  Normal diffusion.  FVC 2.58/90%, FEV1 1.90/88%, ratio 0.74, TLC 96%, DLCO 86% CT chest Hi Res 03/14/2018- 1. Patchy debris throughout the right lower lobe airways, presumably mucoid material. Aspiration is a consideration LAB- Respiratory Allergy Profile 11/27/17- neg for routine environmental allergens, with total IgE < 2 _____________________________________________-    08/09/20- 67 yoF female never smoker followed for  chronic Cough, chronic Bronchitis, complicated by GERD/ Probable Aspiration, aortic Aneurysm, CAD  -Neb albuterol, Atrovent 0.06% nasal spray, singulair, albuterol hfa, Protonix. Covid vax- 3 Phizer Flu vax- had -----Reports wheezing for past 2 days, still having intermittent cough that is sometimes productive with grayish sputum.   Denies fever, nodes, bloody or purulent sputum. Nonspecific, without sick exposure, "MIght" have refluxed.  Has amoxacillin at home.   08/11/21- 21 yoF female never smoker followed for  chronic Cough, chronic Bronchitis, complicated by GERD/ Probable Aspiration, aortic Aneurysm, CAD  -Neb albuterol, Atrovent 0.06% nasal spray, singulair, albuterol hfa, Protonix. Covid vax- 3 Phizer Flu vax- had -----Patient has been having right ear pain, Patient had a MRI(brain) last year, states she has cough  Describes only occasional mild cough producing scant white sputum.  Uses rescue inhaler daily.  Rarely needs her nebulizer machine.  Her son lives with her and sets out her meds. CXR Lilyan Punt-  07/03/21- IMPRESSION: 1. No bony abnormalities. 2. Calcified atherosclerosis in the abdominal and thoracic aorta.   ROS-see HPI   + = positive Constitutional:   No-   weight loss, night sweats, fevers, chills, fatigue, lassitude. HEENT:   No-  headaches, difficulty swallowing, tooth/dental problems, sore throat,       No-  sneezing, itching, ear ache,                               +nasal congestion, post nasal drip,  CV:  No-   chest pain, orthopnea, PND, swelling in lower extremities, anasarca,                                             dizziness, palpitations Resp: No-   shortness of breath with exertion or at rest.               +productive cough,   + cough,  No- coughing up of blood.              No-   change in color of mucus.  No- wheezing.   Skin: No-   rash or lesions. GI:  No-   heartburn, indigestion, abdominal pain, nausea, vomiting,  GU: . MS:   Neuro-     nothing unusual Psych:  No- change in mood or affect. No depression or anxiety.  No memory loss.  OBJ- Physical Exam General- Alert, Oriented, Affect-appropriate, Distress- none acute, + overweight Skin- rash-none, lesions- none, excoriation- none Lymphadenopathy- none Head- atraumatic            Eyes- Gross  vision intact, PERRLA, conjunctivae and secretions clear            Ears- TMs clear            Nose- Clear, no-Septal dev, mucus, polyps, erosion, perforation             Throat- Mallampati III , mucosa clear , drainage- none, tonsils- atrophic, hoarse + Neck- flexible , trachea midline, no stridor , thyroid nl, carotid no bruit Chest - symmetrical excursion , unlabored           Heart/CV- RRR , no murmur , no gallop  , no rub, nl s1 s2                           - JVD- none , edema- none, stasis changes- none, varices- none           Lung-    +Min rhonchi R base , wheeze- none, cough-none , dullness-none, rub- none           Chest wall-  Abd-  Br/ Gen/ Rectal- Not done, not indicated Extrem- cyanosis- none,  clubbing, none, atrophy- none, strength- nl Neuro- grossly intact to observation

## 2021-08-11 ENCOUNTER — Encounter: Payer: Self-pay | Admitting: Internal Medicine

## 2021-08-11 ENCOUNTER — Ambulatory Visit: Payer: Medicare Other | Admitting: Internal Medicine

## 2021-08-11 DIAGNOSIS — K219 Gastro-esophageal reflux disease without esophagitis: Secondary | ICD-10-CM

## 2021-08-11 DIAGNOSIS — J418 Mixed simple and mucopurulent chronic bronchitis: Secondary | ICD-10-CM | POA: Diagnosis not present

## 2021-08-11 MED ORDER — TRELEGY ELLIPTA 100-62.5-25 MCG/ACT IN AEPB: 1.0000 | INHALATION_SPRAY | Freq: Every day | RESPIRATORY_TRACT | 0 refills | Status: DC

## 2021-08-11 NOTE — Patient Instructions (Signed)
Order - sample x 2 Trelegy 100    inhale 1 puff then rinse mouth, once daily ?See if it clears the rattle in your chest.   If it works really well, let us know and we can se d in a prescription. ? ?Please call if we can help ?

## 2021-08-18 ENCOUNTER — Telehealth: Payer: Self-pay | Admitting: Internal Medicine

## 2021-08-18 NOTE — Telephone Encounter (Signed)
Called patient but she did not answer.  

## 2021-08-18 NOTE — Telephone Encounter (Signed)
Sorry she didn't like Trelegy. ?Offer Diflucan 150 mg, # 2, 1 daily     The lingering irritation might be yeast. ?

## 2021-08-18 NOTE — Telephone Encounter (Signed)
Called and spoke with patient. She stated that she was given a sample of Trelegy 119mg at her last OV. She has been using it for the past 8 days. She has not been able to get the taste of Trelegy out of her mouth. She has been brushing her teeth, rinsing her mouth out and even took a wet washcloth in an attempt to wipe the taste out of her mouth. It will not go ahead.  ? ?She stopped using it about 2 days ago and went back to the albuterol nebulizer solution. The taste has gone away.  ? ?She also mentioned her throat has been irritated since using Trelegy 1070m.  ? ?Dr. YoAnnamaria Bootscan you please advise? Thanks!  ?

## 2021-08-25 DIAGNOSIS — I739 Peripheral vascular disease, unspecified: Secondary | ICD-10-CM | POA: Diagnosis not present

## 2021-08-25 DIAGNOSIS — M8580 Other specified disorders of bone density and structure, unspecified site: Secondary | ICD-10-CM | POA: Diagnosis not present

## 2021-08-25 DIAGNOSIS — E039 Hypothyroidism, unspecified: Secondary | ICD-10-CM | POA: Diagnosis not present

## 2021-08-25 DIAGNOSIS — K219 Gastro-esophageal reflux disease without esophagitis: Secondary | ICD-10-CM | POA: Diagnosis not present

## 2021-08-25 DIAGNOSIS — J849 Interstitial pulmonary disease, unspecified: Secondary | ICD-10-CM | POA: Diagnosis not present

## 2021-08-25 DIAGNOSIS — R350 Frequency of micturition: Secondary | ICD-10-CM | POA: Diagnosis not present

## 2021-08-25 DIAGNOSIS — I10 Essential (primary) hypertension: Secondary | ICD-10-CM | POA: Diagnosis not present

## 2021-08-25 DIAGNOSIS — I679 Cerebrovascular disease, unspecified: Secondary | ICD-10-CM | POA: Diagnosis not present

## 2021-08-25 DIAGNOSIS — Z0000 Encounter for general adult medical examination without abnormal findings: Secondary | ICD-10-CM | POA: Diagnosis not present

## 2021-08-25 DIAGNOSIS — E7800 Pure hypercholesterolemia, unspecified: Secondary | ICD-10-CM | POA: Diagnosis not present

## 2021-09-08 DIAGNOSIS — E039 Hypothyroidism, unspecified: Secondary | ICD-10-CM | POA: Diagnosis not present

## 2021-09-09 ENCOUNTER — Encounter: Payer: Self-pay | Admitting: Internal Medicine

## 2021-09-09 NOTE — Assessment & Plan Note (Signed)
To continue practicing routine reflux precautions.

## 2021-09-09 NOTE — Assessment & Plan Note (Signed)
We are going to try adding Trelegy 100 sample with appropriate discussion done.

## 2021-09-13 DIAGNOSIS — M85851 Other specified disorders of bone density and structure, right thigh: Secondary | ICD-10-CM | POA: Diagnosis not present

## 2021-09-13 DIAGNOSIS — M810 Age-related osteoporosis without current pathological fracture: Secondary | ICD-10-CM | POA: Diagnosis not present

## 2021-10-01 ENCOUNTER — Other Ambulatory Visit: Payer: Self-pay | Admitting: Internal Medicine

## 2021-10-06 MED ORDER — FLUCONAZOLE 100 MG PO TABS: 100.0000 mg | ORAL_TABLET | Freq: Every day | ORAL | 0 refills | Status: DC

## 2021-10-06 NOTE — Telephone Encounter (Signed)
Called and spoke with pt to see if the irritation in her throat was better and she said it was better but she still had some irritation. Stated to her the recs per CY and she verbalized understanding. Rx for diflucan has been sent to pharmacy for pt. Nothing further needed.

## 2021-10-26 ENCOUNTER — Encounter: Payer: Self-pay | Admitting: Neurology

## 2021-10-26 ENCOUNTER — Telehealth (INDEPENDENT_AMBULATORY_CARE_PROVIDER_SITE_OTHER): Payer: Medicare Other | Admitting: Neurology

## 2021-10-26 VITALS — Ht 65.0 in | Wt 180.0 lb

## 2021-10-26 DIAGNOSIS — G3184 Mild cognitive impairment, so stated: Secondary | ICD-10-CM | POA: Diagnosis not present

## 2021-10-26 MED ORDER — DONEPEZIL HCL 5 MG PO TABS: ORAL_TABLET | ORAL | 11 refills | Status: AC

## 2021-10-26 NOTE — Progress Notes (Signed)
Virtual Visit via Video Note The purpose of this virtual visit is to provide medical care while limiting exposure to the novel coronavirus.    Consent was obtained for video visit:  Yes.   Answered questions that patient had about telehealth interaction:  Yes.   I discussed the limitations, risks, security and privacy concerns of performing an evaluation and management service by telemedicine. I also discussed with the patient that there may be a patient responsible charge related to this service. The patient expressed understanding and agreed to proceed.  Pt location: Home Physician Location: office Name of referring provider:  Shirline Frees, MD I connected with Yolanda Hill at patients initiation/request on 10/26/2021 at  3:00 PM EDT by video enabled telemedicine application and verified that I am speaking with the correct person using two identifiers. Pt MRN:  [id number] Pt DOB:  1940-10-14 Video Participants:  Yolanda Hill;  Darin Engels (son)   History of Present Illness:  The patient had a virtual video visit on 10/26/2021. She was last seen in the Neurology clinic 6 months ago by Memory Disorders PA Sharene Butters. MMSE 29/30 in January 2023. Her son Yolanda Hill is present during the visit. Since her last visit, she reports her memory has been fine, every now and then she has to think for a minute. Yolanda Hill notes she is having more short-term memory changes, forgetting conversations earlier that day, or what she had for lunch. This is not all the time but he is noticing it a little more. She lives with her son who manages her medications and makes sure she takes them. He states if he did not do it, she would forget them. She continues to manage her finances fine, most are on autodraft. She states she has not been driving. She denies leaving the stove on. She is independent with dressing and bathing but Yolanda Hill is on standby for showers. She requested for a virtual visit today because she is  feeling very weak, really tired, not a lot of energy. She thinks it is related to her allergies/weather, no fever, headaches, dizziness, focal numbness/tingling/weakness, no falls. Sleep and mood are good. No paranoia or hallucinations.   History on Initial Assessment 10/25/2020: This is a pleasant 81 year old right-handed woman with a history of hypertension, hyperlipidemia, hypothyroidism, neuropathy, presenting for evaluation of memory loss. She reported concerns to her PCP in 03/2020, she was forgetting conversations, what she ate earlier, having word-finding difficulties. It was noted her TSH was significantly abnormal due to missing her [place] thyroid, and that timing of memory loss lined up with thyroid dosing being off, however later on repeat TSH was normal and felt not to be the cause. B12 was low (176), she was started on B12 supplements. She states that her memory is "actually better than I thought it was." She feels it may have something to do with her hearing as well. Her son has been living with her for 10 years. She continues to drive short distances and denies getting lost. Her son drives her most of the time. Her son fixes her pillbox and she takes the medications by herself with no difficulties. She notes that she saw a pill on the floor recently but this is unusual. She continues to manage finances without issues and tries to do most on draft. Her son mostly cooks, she denies leaving the stove on. Her son has not mentioned any memory concerns. She is independent with dressing and bathing. No family history of  dementia, significant head injuries. She occasionally drinks a cooler or glass of wine.   She has pain going down the back of her head to the right side of her neck every now and then. She denies any dizziness, dysarthria/dysphagia, focal numbness/tingling/weakness, bladder dysfunction, tremors. She has occasional neck and left hip pain. She has occasional constipation. Her sense of smell  has never been real good. She sleeps well with 1/2 Ambien and 1-2 tablets of gabapentin prn. Mood is good. 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       Current Outpatient Medications on File Prior to Visit  Medication Sig Dispense Refill   acetaminophen (TYLENOL) 500 MG tablet Take 1,000 mg by mouth daily as needed.     albuterol (PROVENTIL) (2.5 MG/3ML) 0.083% nebulizer solution USE 1 VIAL VIA NEBULIZER EVERY 6 HOURS AS NEEDED FOR WHEEZING OR SHORTNESS OF BREATH 75 mL 5   albuterol (VENTOLIN HFA) 108 (90 Base) MCG/ACT inhaler Inhale 2 puffs every 6 hours if needed 18 g 12   b complex vitamins tablet Take 1 tablet by mouth daily.     benzonatate (TESSALON) 200 MG capsule TAKE 1 CAPSULE(200 MG) BY MOUTH EVERY 8 HOURS AS NEEDED FOR COUGH 50 capsule 2   Calcium Carbonate-Vitamin D (CALCIUM-VITAMIN D) 500-200 MG-UNIT per tablet Take 1 tablet by mouth daily.     cetirizine (ZYRTEC) 10 MG tablet Take 1 tablet by mouth daily.     cholecalciferol (VITAMIN D) 1000 UNITS tablet Take 1,000 Units by mouth daily.     Fluticasone-Umeclidin-Vilant (TRELEGY ELLIPTA) 100-62.5-25 MCG/ACT AEPB Inhale 1 puff into the lungs daily. 60 each 0   gabapentin (NEURONTIN) 300 MG capsule Take 1 capsule by mouth at bedtime.   11   guaiFENesin (ROBITUSSIN) 100 MG/5ML liquid Take 5 mLs by mouth every 4 (four) hours as needed for cough or to loosen phlegm.     LORazepam (ATIVAN) 0.5 MG tablet Take 0.5 mg by mouth every 6 (six) hours as needed for anxiety.     Magnesium 100 MG TABS Take by mouth.     montelukast (SINGULAIR) 10 MG tablet TAKE 1 TABLET(10 MG) BY MOUTH AT BEDTIME 30 tablet 12   Multiple Vitamin  (MULTIVITAMIN) capsule Take 1 capsule by mouth daily.     pantoprazole (PROTONIX) 20 MG tablet Take 20 mg by mouth daily.     Potassium 75 MG TABS Take by mouth.     Respiratory Therapy Supplies (FLUTTER) DEVI Blow through 4 times per set, three sets daily 1 each 0   sodium chloride (OCEAN) 0.65 % SOLN nasal spray Place 1 spray into both nostrils as needed for congestion.     telmisartan-hydrochlorothiazide (MICARDIS HCT) 40-12.5 MG tablet TK 1 T PO QD  5   vitamin B-12 (CYANOCOBALAMIN) 500 MCG tablet Take 500 mcg by mouth daily.     zolpidem (AMBIEN) 10 MG tablet Take 5-10 mg by mouth At bedtime as needed for sleep.      fluconazole (DIFLUCAN) 100 MG tablet Take 1 tablet (100 mg total) by mouth daily. 2 tablet 0   No current facility-administered medications on file prior to visit.     Observations/Objective:   Vitals:   10/26/21 1345  Weight: 180 lb (81.6 kg)  Height: '5\' 5"'$  (1.651 m)   GEN:  The patient appears stated age and is in NAD.  Neurological examination: Patient is awake, alert. No aphasia or dysarthria. Intact fluency and comprehension. Cranial nerves: Extraocular movements intact with no nystagmus. No facial asymmetry. Motor: moves all extremities symmetrically, at least anti-gravity x 4.    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 MRI brain showed small chronic lacunar infarcts and moderate chronic microvascular disease. She overall continues to manage complex tasks fine, however her son reports continued worsening of short-term memory. We discussed starting Donepezil, including side effects and expectations, start Donepezil '5mg'$  1/2 tablet daily for 2 weeks, then increase to 1 tablet daily. We discussed the importance of control of vascular risk factors, physical exercise, brain stimulation exercises, MIND diet for overall  brain health. Follow-up with Memory Disorders PA Sharene Butters in 6 months, call for any changes.    Follow Up Instructions:   -I discussed the assessment and treatment plan with the patient. The patient was provided an opportunity to ask questions and all were answered. The patient agreed with the plan and demonstrated an understanding of the instructions.   The patient was advised to call back or seek an in-person evaluation if the symptoms worsen or if the condition fails to improve as anticipated.    Cameron Sprang, MD

## 2021-10-27 NOTE — Patient Instructions (Signed)
Good to see you.  Start Donepezil '5mg'$ : Take 1/2 tablet daily for 2 weeks, then increase to 1 tablet daily  2. Follow-up in 6 months, call for any changes   FALL PRECAUTIONS: Be cautious when walking. Scan the area for obstacles that may increase the risk of trips and falls. When getting up in the mornings, sit up at the edge of the bed for a few minutes before getting out of bed. Consider elevating the bed at the head end to avoid drop of blood pressure when getting up. Walk always in a well-lit room (use night lights in the walls). Avoid area rugs or power cords from appliances in the middle of the walkways. Use a walker or a cane if necessary and consider physical therapy for balance exercise. Get your eyesight checked regularly.  FINANCIAL OVERSIGHT: Supervision, especially oversight when making financial decisions or transactions is also recommended.  HOME SAFETY: Consider the safety of the kitchen when operating appliances like stoves, microwave oven, and blender. Consider having supervision and share cooking responsibilities until no longer able to participate in those. Accidents with firearms and other hazards in the house should be identified and addressed as well.  DRIVING: Regarding driving, in patients with progressive memory problems, driving will be impaired. We advise to have someone else do the driving if trouble finding directions or if minor accidents are reported. Independent driving assessment is available to determine safety of driving.  ABILITY TO BE LEFT ALONE: If patient is unable to contact 911 operator, consider using LifeLine, or when the need is there, arrange for someone to stay with patients. Smoking is a fire hazard, consider supervision or cessation. Risk of wandering should be assessed by caregiver and if detected at any point, supervision and safe proof recommendations should be instituted.  MEDICATION SUPERVISION: Inability to self-administer medication needs to be  constantly addressed. Implement a mechanism to ensure safe administration of the medications.  RECOMMENDATIONS FOR ALL PATIENTS WITH MEMORY PROBLEMS: 1. Continue to exercise (Recommend 30 minutes of walking everyday, or 3 hours every week) 2. Increase social interactions - continue going to [place] and enjoy social gatherings with friends and family 3. Eat healthy, avoid fried foods and eat more fruits and vegetables 4. Maintain adequate blood pressure, blood sugar, and blood cholesterol level. Reducing the risk of stroke and cardiovascular disease also helps promoting better memory. 5. Avoid stressful situations. Live a simple life and avoid aggravations. Organize your time and prepare for the next day in anticipation. 6. Sleep well, avoid any interruptions of sleep and avoid any distractions in the bedroom that may interfere with adequate sleep quality 7. Avoid sugar, avoid sweets as there is a strong link between excessive sugar intake, diabetes, and cognitive impairment The Mediterranean diet has been shown to help patients reduce the risk of progressive memory disorders and reduces cardiovascular risk. This includes eating fish, eat fruits and green leafy vegetables, nuts like almonds and hazelnuts, walnuts, and also use olive oil. Avoid fast foods and fried foods as much as possible. Avoid sweets and sugar as sugar use has been linked to worsening of memory function.  There is always a concern of gradual progression of memory problems. If this is the case, then we may need to adjust level of care according to patient needs. Support, both to the patient and caregiver, should then be put into place.

## 2021-10-31 ENCOUNTER — Telehealth: Payer: Self-pay | Admitting: Neurology

## 2021-10-31 NOTE — Telephone Encounter (Signed)
Pt called in stating she took the donepezil and it made her dizzy and nauseous. She does not want to take this medication anymore.

## 2021-11-22 ENCOUNTER — Ambulatory Visit (INDEPENDENT_AMBULATORY_CARE_PROVIDER_SITE_OTHER): Payer: Medicare Other

## 2021-11-22 ENCOUNTER — Ambulatory Visit: Payer: Medicare Other | Admitting: Internal Medicine

## 2021-11-22 ENCOUNTER — Encounter: Payer: Self-pay | Admitting: Internal Medicine

## 2021-11-22 VITALS — BP 102/64 | HR 85 | Ht 64.0 in | Wt 180.4 lb

## 2021-11-22 DIAGNOSIS — J418 Mixed simple and mucopurulent chronic bronchitis: Secondary | ICD-10-CM

## 2021-11-22 DIAGNOSIS — M419 Scoliosis, unspecified: Secondary | ICD-10-CM | POA: Diagnosis not present

## 2021-11-22 DIAGNOSIS — J690 Pneumonitis due to inhalation of food and vomit: Secondary | ICD-10-CM

## 2021-11-22 DIAGNOSIS — J40 Bronchitis, not specified as acute or chronic: Secondary | ICD-10-CM | POA: Diagnosis not present

## 2021-11-22 MED ORDER — DOXYCYCLINE HYCLATE 100 MG PO TABS: 100.0000 mg | ORAL_TABLET | Freq: Two times a day (BID) | ORAL | 0 refills | Status: DC

## 2021-11-22 NOTE — Progress Notes (Signed)
HPI  female never smoker followed for chronic cough, chronic bronchitis, complicated by GERD, ectatic aorta Allergy profile 03/17/2014-negative-total IgE only 3 with no specific elevations on the panel. CBC showed WBC 10,800, eosinophils 4%, PMN 69.3%  ENT dr  told possibly GERD was told Acid reflux and caffiene is causing this cough Office Spirometry 05/01/2016-WNL. FVC 2.57/90%, FEV1 101/94%, ratio 0.78, FEF 25-75% 1.97/120% PFT 03/21/18-WNL or minimal obstruction.  Normal diffusion.  FVC 2.58/90%, FEV1 1.90/88%, ratio 0.74, TLC 96%, DLCO 86% CT chest Hi Res 03/14/2018- 1. Patchy debris throughout the right lower lobe airways, presumably mucoid material. Aspiration is a consideration LAB- Respiratory Allergy Profile 11/27/17- neg for routine environmental allergens, with total IgE < 2 _____________________________________________-   08/11/21- 66 yoF female never smoker followed for  chronic Cough, chronic Bronchitis, complicated by GERD/ Probable Aspiration, aortic Aneurysm, CAD  -Neb albuterol, Atrovent 0.06% nasal spray, singulair, albuterol hfa, Protonix. Trelegy 100,  Covid vax- 3 Phizer Flu vax- had -----Patient has been having right ear pain, Patient had a MRI(brain) last year, states she has cough  Describes only occasional mild cough producing scant white sputum.  Uses rescue inhaler daily.  Rarely needs her nebulizer machine.  Her son lives with her and sets out her meds. CXR Lilyan Punt- 07/03/21- IMPRESSION: 1. No bony abnormalities. 2. Calcified atherosclerosis in the abdominal and thoracic aorta.  11/22/21- 16 yoF female never smoker followed for  chronic Cough, chronic Bronchitis, complicated by GERD/ Probable Aspiration, aortic Aneurysm, CAD  -Neb albuterol, Atrovent 0.06% nasal spray, singulair, albuterol hfa, Protonix.  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 Son Antony Haste lives with pt and manages meds. -----Pt f/u has had  a cough for the last couple weeks, feels like her right lung has a "rattling" in it no matter what she does. Reports not having any fevers or chills.  Trelegy gave thrush so quit. Describes rattling cough for the past 2 weeks, scant sputum is clear or yellow.  ROS-see HPI   + = positive Constitutional:   No-   weight loss, night sweats, fevers, chills, fatigue, lassitude. HEENT:   No-  headaches, difficulty swallowing, tooth/dental problems, sore throat,       No-  sneezing, itching, ear ache,                               +nasal congestion, post nasal drip,  CV:  No-   chest pain, orthopnea, PND, swelling in lower extremities, anasarca,                                             dizziness, palpitations Resp: No-   shortness of breath with exertion or at rest.               +productive cough,   + cough,  No- coughing up of blood.              No-   change in color of mucus.  No- wheezing.   Skin: No-   rash or lesions. GI:  No-   heartburn, indigestion, abdominal pain, nausea, vomiting,  GU: . MS:   Neuro-     nothing unusual Psych:  No- change in mood or affect. No depression or anxiety.  No memory loss.  OBJ- Physical Exam  General- Alert, Oriented, Affect-appropriate, Distress- none acute, + overweight Skin- rash-none, lesions- none, excoriation- none Lymphadenopathy- none Head- atraumatic            Eyes- Gross vision intact, PERRLA, conjunctivae and secretions clear            Ears- TMs clear            Nose- Clear, no-Septal dev, mucus, polyps, erosion, perforation             Throat- Mallampati III , mucosa clear , drainage- none, tonsils- atrophic, hoarse + Neck- flexible , trachea midline, no stridor , thyroid nl, carotid no bruit Chest - symmetrical excursion , unlabored           Heart/CV- RRR , no murmur , no gallop  , no rub, nl s1 s2                           - JVD- none , edema- none, stasis changes- none, varices- none           Lung-    + rhonchi R base/ unlabored ,  cough+mild/wheezy , dullness-none, rub- none           Chest wall-  Abd-  Br/ Gen/ Rectal- Not done, not indicated Extrem- cyanosis- none, clubbing, none, atrophy- none, strength- nl Neuro- grossly intact to observation

## 2021-11-22 NOTE — Patient Instructions (Addendum)
Script sent for doxycycline antibiotic to treat the bronchitis.  Order CXR   dx Bronchitis exacerbation  Please call if we can help

## 2021-12-14 ENCOUNTER — Encounter: Payer: Self-pay | Admitting: Internal Medicine

## 2021-12-14 NOTE — Assessment & Plan Note (Signed)
Mild exacerbation of chronic bronchitis. Plan-CXR, doxycycline. Use Flutter

## 2021-12-14 NOTE — Assessment & Plan Note (Signed)
Suspect chronic bronchitis reflects recurrent microaspiration. Plan-reflux precautions and attention to aspiration emphasized again.

## 2021-12-15 DIAGNOSIS — J309 Allergic rhinitis, unspecified: Secondary | ICD-10-CM | POA: Diagnosis not present

## 2021-12-15 DIAGNOSIS — I10 Essential (primary) hypertension: Secondary | ICD-10-CM | POA: Diagnosis not present

## 2022-01-24 DIAGNOSIS — Z79899 Other long term (current) drug therapy: Secondary | ICD-10-CM | POA: Diagnosis not present

## 2022-01-24 DIAGNOSIS — Z23 Encounter for immunization: Secondary | ICD-10-CM | POA: Diagnosis not present

## 2022-01-24 DIAGNOSIS — D538 Other specified nutritional anemias: Secondary | ICD-10-CM | POA: Diagnosis not present

## 2022-02-21 ENCOUNTER — Ambulatory Visit (INDEPENDENT_AMBULATORY_CARE_PROVIDER_SITE_OTHER): Payer: Medicare Other

## 2022-02-21 ENCOUNTER — Ambulatory Visit: Payer: Medicare Other | Admitting: Internal Medicine

## 2022-02-21 ENCOUNTER — Encounter: Payer: Self-pay | Admitting: Internal Medicine

## 2022-02-21 VITALS — BP 112/66 | HR 87 | Ht 64.5 in | Wt 180.6 lb

## 2022-02-21 DIAGNOSIS — J209 Acute bronchitis, unspecified: Secondary | ICD-10-CM

## 2022-02-21 DIAGNOSIS — J440 Chronic obstructive pulmonary disease with acute lower respiratory infection: Secondary | ICD-10-CM

## 2022-02-21 DIAGNOSIS — J418 Mixed simple and mucopurulent chronic bronchitis: Secondary | ICD-10-CM | POA: Diagnosis not present

## 2022-02-21 DIAGNOSIS — K219 Gastro-esophageal reflux disease without esophagitis: Secondary | ICD-10-CM | POA: Diagnosis not present

## 2022-02-21 LAB — POCT INFLUENZA A/B
Influenza A, POC: NEGATIVE
Influenza B, POC: NEGATIVE

## 2022-02-21 LAB — POC COVID19 BINAXNOW: SARS Coronavirus 2 Ag: NEGATIVE

## 2022-02-21 MED ORDER — AZITHROMYCIN 250 MG PO TABS: ORAL_TABLET | ORAL | 0 refills | Status: DC

## 2022-02-21 NOTE — Progress Notes (Signed)
HPI  female never smoker followed for chronic cough, chronic bronchitis, complicated by GERD, ectatic aorta Allergy profile 03/17/2014-negative-total IgE only 3 with no specific elevations on the panel. CBC showed WBC 10,800, eosinophils 4%, PMN 69.3%  ENT dr  told possibly GERD was told Acid reflux and caffiene is causing this cough Office Spirometry 05/01/2016-WNL. FVC 2.57/90%, FEV1 101/94%, ratio 0.78, FEF 25-75% 1.97/120% PFT 03/21/18-WNL or minimal obstruction.  Normal diffusion.  FVC 2.58/90%, FEV1 1.90/88%, ratio 0.74, TLC 96%, DLCO 86% CT chest Hi Res 03/14/2018- 1. Patchy debris throughout the right lower lobe airways, presumably mucoid material. Aspiration is a consideration LAB- Respiratory Allergy Profile 11/27/17- neg for routine environmental allergens, with total IgE < 2 _____________________________________________-   11/22/21- 47 yoF female never smoker followed for  chronic Cough, chronic Bronchitis, complicated by GERD/ Probable Aspiration, aortic Aneurysm, CAD  -Neb albuterol, Atrovent 0.06% nasal spray, singulair, albuterol hfa, Protonix.  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 Son Antony Haste lives with pt and manages meds. -----Pt f/u has had a cough for the last couple weeks, feels like her right lung has a "rattling" in it no matter what she does. Reports not having any fevers or chills.  Trelegy gave thrush so quit. Describes rattling cough for the past 2 weeks, scant sputum is clear or yellow.  02/21/22- 54 yoF female never smoker followed for  chronic Cough, chronic Bronchitis, complicated by GERD/ Probable Aspiration, aortic Aneurysm, CAD  -Neb albuterol, Atrovent 0.06% nasal spray, singulair, albuterol hfa, Protonix.  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 Son Antony Haste lives with pt and manages meds. -----Acute visit-Pt has been coughing  x1 week. Tried otc cough syrup and nebulizer, nothing is help. Throat is now sore due to the coughing. Hard cough, productive green with little fever or chill.  Sore throat from coughing.  Tongue coated brown.  No recent antibiotics.  1 day loose black stools.  Denies abdominal pain or vomiting. CXR 11/23/21-  IMPRESSION: No active cardiopulmonary disease.  ROS-see HPI   + = positive Constitutional:   No-   weight loss, night sweats, fevers, chills, fatigue, lassitude. HEENT:   No-  headaches, difficulty swallowing, tooth/dental problems, sore throat,       No-  sneezing, itching, ear ache,                               +nasal congestion, post nasal drip,  CV:  No-   chest pain, orthopnea, PND, swelling in lower extremities, anasarca,                                              dizziness, palpitations Resp: No-   shortness of breath with exertion or at rest.               +productive cough,   + cough,  No- coughing up of blood.              +change in color of mucus.  No- wheezing.   Skin: No-   rash or lesions. GI:  No-   heartburn, indigestion, abdominal pain, nausea, vomiting,  GU: . MS:   Neuro-     nothing unusual Psych:  No- change in mood  or affect. No depression or anxiety.  No memory loss.  OBJ- Physical Exam General- Alert, Oriented, Affect-appropriate, Distress- none acute, + overweight Skin- rash-none, lesions- none, excoriation- none Lymphadenopathy- none Head- atraumatic            Eyes- Gross vision intact, PERRLA, conjunctivae and secretions clear            Ears- TMs clear            Nose- Clear, no-Septal dev, mucus, polyps, erosion, perforation             Throat- Mallampati III , mucosa +brown coated ,drainage- none, tonsils- atrophic, hoarse + Neck- flexible , trachea midline, no stridor , thyroid nl, carotid no bruit Chest - symmetrical excursion , unlabored           Heart/CV- RRR , no murmur , no gallop  , no rub, nl s1 s2                           - JVD-  none , edema- none, stasis changes- none, varices- none           Lung-    +trace crackles , cough+deep , dullness-none, rub- none           Chest wall-  Abd-  Br/ Gen/ Rectal- Not done, not indicated Extrem- cyanosis- none, clubbing, none, atrophy- none, strength- nl Neuro- grossly intact to observation

## 2022-02-21 NOTE — Patient Instructions (Addendum)
Order- CXR    dxx acute bronchitis  Order lab- CBC w dif, BMET    dx acutd bronchitis  Ok to use your cough syrup  Script sent for Zpak antibiotic  Order- Swab tests for flu and covid  Keep appointment with Dr Annamaria Boots in February

## 2022-02-22 LAB — CBC WITH DIFFERENTIAL/PLATELET
Basophils Absolute: 0.1 10*3/uL (ref 0.0–0.1)
Basophils Relative: 0.9 % (ref 0.0–3.0)
Eosinophils Absolute: 0.4 10*3/uL (ref 0.0–0.7)
Eosinophils Relative: 4.9 % (ref 0.0–5.0)
HCT: 36.6 % (ref 36.0–46.0)
Hemoglobin: 11.9 g/dL — ABNORMAL LOW (ref 12.0–15.0)
Lymphocytes Relative: 23.5 % (ref 12.0–46.0)
Lymphs Abs: 1.9 10*3/uL (ref 0.7–4.0)
MCHC: 32.6 g/dL (ref 30.0–36.0)
MCV: 87.1 fl (ref 78.0–100.0)
Monocytes Absolute: 0.8 10*3/uL (ref 0.1–1.0)
Monocytes Relative: 10.2 % (ref 3.0–12.0)
Neutro Abs: 4.8 10*3/uL (ref 1.4–7.7)
Neutrophils Relative %: 60.5 % (ref 43.0–77.0)
Platelets: 329 10*3/uL (ref 150.0–400.0)
RBC: 4.2 Mil/uL (ref 3.87–5.11)
RDW: 20.2 % — ABNORMAL HIGH (ref 11.5–15.5)
WBC: 7.9 10*3/uL (ref 4.0–10.5)

## 2022-02-22 LAB — BASIC METABOLIC PANEL
BUN: 4 mg/dL — ABNORMAL LOW (ref 6–23)
CO2: 27 mEq/L (ref 19–32)
Calcium: 9 mg/dL (ref 8.4–10.5)
Chloride: 100 mEq/L (ref 96–112)
Creatinine, Ser: 0.79 mg/dL (ref 0.40–1.20)
GFR: 69.91 mL/min (ref >60.00)
Glucose, Bld: 105 mg/dL — ABNORMAL HIGH (ref 70–99)
Potassium: 4.1 mEq/L (ref 3.5–5.1)
Sodium: 132 mEq/L — ABNORMAL LOW (ref 135–145)

## 2022-02-27 DIAGNOSIS — I679 Cerebrovascular disease, unspecified: Secondary | ICD-10-CM | POA: Diagnosis not present

## 2022-02-27 DIAGNOSIS — J309 Allergic rhinitis, unspecified: Secondary | ICD-10-CM | POA: Diagnosis not present

## 2022-02-27 DIAGNOSIS — E039 Hypothyroidism, unspecified: Secondary | ICD-10-CM | POA: Diagnosis not present

## 2022-02-27 DIAGNOSIS — J849 Interstitial pulmonary disease, unspecified: Secondary | ICD-10-CM | POA: Diagnosis not present

## 2022-02-27 DIAGNOSIS — E7800 Pure hypercholesterolemia, unspecified: Secondary | ICD-10-CM | POA: Diagnosis not present

## 2022-02-27 DIAGNOSIS — G4700 Insomnia, unspecified: Secondary | ICD-10-CM | POA: Diagnosis not present

## 2022-02-27 DIAGNOSIS — K219 Gastro-esophageal reflux disease without esophagitis: Secondary | ICD-10-CM | POA: Diagnosis not present

## 2022-02-27 DIAGNOSIS — Z23 Encounter for immunization: Secondary | ICD-10-CM | POA: Diagnosis not present

## 2022-02-27 DIAGNOSIS — I10 Essential (primary) hypertension: Secondary | ICD-10-CM | POA: Diagnosis not present

## 2022-02-27 DIAGNOSIS — I739 Peripheral vascular disease, unspecified: Secondary | ICD-10-CM | POA: Diagnosis not present

## 2022-03-09 ENCOUNTER — Encounter: Payer: Self-pay | Admitting: Internal Medicine

## 2022-03-09 NOTE — Assessment & Plan Note (Signed)
Acute bronchitis exacerbation with potential for bronchopneumonia Plan-chest x-ray, Z-Pak, CBC with differential, BMET

## 2022-03-09 NOTE — Assessment & Plan Note (Signed)
She describes some black stools without detail.  Discussed potential for upper GI bleed.  She will watch for changes send discussed with her primary physician as needed.

## 2022-03-23 ENCOUNTER — Telehealth: Payer: Self-pay

## 2022-03-23 NOTE — Telephone Encounter (Signed)
Patient said she wanted to let us know that she took half a pill of donepezil (ARICEPT) 5 MG tablet and had a bad psychological reaction to it, said made her feel like she was going crazy, that her head was swelling.

## 2022-03-24 NOTE — Telephone Encounter (Signed)
Patient advised and thanked me for calling, will hold aricept

## 2022-04-27 DIAGNOSIS — H35373 Puckering of macula, bilateral: Secondary | ICD-10-CM | POA: Diagnosis not present

## 2022-05-01 ENCOUNTER — Ambulatory Visit: Payer: Medicare Other | Admitting: Physician Assistant

## 2022-05-20 ENCOUNTER — Other Ambulatory Visit: Payer: Self-pay | Admitting: Internal Medicine

## 2022-05-20 DIAGNOSIS — J410 Simple chronic bronchitis: Secondary | ICD-10-CM

## 2022-05-23 NOTE — Progress Notes (Signed)
HPI  female never smoker followed for chronic cough, chronic bronchitis, complicated by GERD, ectatic aorta Allergy profile 03/17/2014-negative-total IgE only 3 with no specific elevations on the panel. CBC showed WBC 10,800, eosinophils 4%, PMN 69.3%  ENT dr  told possibly GERD was told Acid reflux and caffiene is causing this cough Office Spirometry 05/01/2016-WNL. FVC 2.57/90%, FEV1 101/94%, ratio 0.78, FEF 25-75% 1.97/120% PFT 03/21/18-WNL or minimal obstruction.  Normal diffusion.  FVC 2.58/90%, FEV1 1.90/88%, ratio 0.74, TLC 96%, DLCO 86% CT chest Hi Res 03/14/2018- 1. Patchy debris throughout the right lower lobe airways, presumably mucoid material. Aspiration is a consideration LAB- Respiratory Allergy Profile 11/27/17- neg for routine environmental allergens, with total IgE < 2 _____________________________________________-    02/21/22- 66 yoF female never smoker followed for  chronic Cough, chronic Bronchitis, complicated by GERD/ Probable Aspiration, aortic Aneurysm, CAD  -Neb albuterol, Atrovent 0.06% nasal spray, singulair, albuterol hfa, Protonix.  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 Son Antony Haste lives with pt and manages meds. -----Acute visit-Pt has been coughing x1 week. Tried otc cough syrup and nebulizer, nothing is help. Throat is now sore due to the coughing. Hard cough, productive green with little fever or chill.  Sore throat from coughing.  Tongue coated brown.  No recent antibiotics.  1 day loose black stools.  Denies abdominal pain or vomiting. CXR 11/23/21-  IMPRESSION: No active cardiopulmonary disease.  05/25/22- 38 yoF female never smoker followed for  chronic Cough, chronic Bronchitis, complicated by GERD/ Probable Aspiration, aortic Aneurysm, CAD  -Neb albuterol, Atrovent 0.06% nasal spray, singulair, albuterol hfa, Protonix.  Covid vax- 3 Phizer Flu vax-had -----Patient states she has  been coughing for several weeks now, with occ phlegm and wheezing. Has been using otc cough syrup Chronic problem with recurrence.  Does not recognize specific triggering event.  Using her rescue inhaler 4 times daily.  We reviewed her other meds.  She dislikes powdered inhalers.  We are going to let her try Breztri. CXR 02/23/22- IMPRESSION: No acute cardiopulmonary disease.  ROS-see HPI   + = positive Constitutional:   No-   weight loss, night sweats, fevers, chills, fatigue, lassitude. HEENT:   No-  headaches, difficulty swallowing, tooth/dental problems, sore throat,       No-  sneezing, itching, ear ache,                               +nasal congestion, post nasal drip,  CV:  No-   chest pain, orthopnea, PND, swelling in lower extremities, anasarca,                                              dizziness, palpitations Resp: No-   shortness of breath with exertion or at rest.               +productive cough,   + cough,  No- coughing up of blood.              +change in color of mucus.  No- wheezing.   Skin: No-   rash or lesions. GI:  No-   heartburn, indigestion, abdominal pain, nausea, vomiting,  GU: . MS:   Neuro-     nothing unusual Psych:  No- change in mood or affect.  No depression or anxiety.  No memory loss.  OBJ- Physical Exam General- Alert, Oriented, Affect-appropriate, Distress- none acute, + overweight Skin- rash-none, lesions- none, excoriation- none Lymphadenopathy- none Head- atraumatic            Eyes- Gross vision intact, PERRLA, conjunctivae and secretions clear            Ears- TMs clear            Nose- Clear, no-Septal dev, mucus, polyps, erosion, perforation             Throat- Mallampati III , mucosa +brown coated ,drainage- none, tonsils- atrophic, hoarse + Neck- flexible , trachea midline, no stridor , thyroid nl, carotid no bruit Chest - symmetrical excursion , unlabored           Heart/CV- RRR , no murmur , no gallop  , no rub, nl s1 s2                            - JVD- none , edema- none, stasis changes- none, varices- none           Lung-    +trace crackles, wheeze+,  cough+rattling , dullness-none, rub- none           Chest wall-  Abd-  Br/ Gen/ Rectal- Not done, not indicated Extrem- cyanosis- none, clubbing, none, atrophy- none, strength- nl Neuro- grossly intact to observation

## 2022-05-25 ENCOUNTER — Encounter: Payer: Self-pay | Admitting: Internal Medicine

## 2022-05-25 ENCOUNTER — Ambulatory Visit: Payer: Medicare Other | Admitting: Internal Medicine

## 2022-05-25 VITALS — BP 128/78 | HR 83 | Ht 64.5 in | Wt 177.6 lb

## 2022-05-25 DIAGNOSIS — K219 Gastro-esophageal reflux disease without esophagitis: Secondary | ICD-10-CM

## 2022-05-25 DIAGNOSIS — J418 Mixed simple and mucopurulent chronic bronchitis: Secondary | ICD-10-CM

## 2022-05-25 MED ORDER — AZITHROMYCIN 250 MG PO TABS: ORAL_TABLET | ORAL | 0 refills | Status: DC

## 2022-05-25 MED ORDER — ALBUTEROL SULFATE HFA 108 (90 BASE) MCG/ACT IN AERS: INHALATION_SPRAY | RESPIRATORY_TRACT | 12 refills | Status: DC

## 2022-05-25 MED ORDER — BREZTRI AEROSPHERE 160-9-4.8 MCG/ACT IN AERO: 2.0000 | INHALATION_SPRAY | Freq: Two times a day (BID) | RESPIRATORY_TRACT | 0 refills | Status: DC

## 2022-05-25 NOTE — Patient Instructions (Signed)
Order- - sample Breztri inhaler       inhale 2 puffs, then rinse mouth, twice daily- see if this helps  Refill script sent for your albuterol rescue inhaler  2 puffs up to 4 times daily , IF NEEDED  Script sent for Zpak antibiotic

## 2022-06-21 NOTE — Assessment & Plan Note (Signed)
No objective proof of suspected low-grade intermittent reflux/aspiration. Plan-continue reflux control measures.

## 2022-06-21 NOTE — Assessment & Plan Note (Signed)
We have worked on chronic microaspiration as a likely major factor. Current acute exacerbation will be managed as acute bronchitis watching for response. Plan-Z-Pak.  Samples of Breztri inhaler.

## 2022-08-14 ENCOUNTER — Ambulatory Visit: Payer: Medicare Other | Admitting: Internal Medicine

## 2022-08-22 NOTE — Progress Notes (Unsigned)
HPI  female never smoker followed for chronic cough, chronic bronchitis, complicated by GERD, ectatic aorta Allergy profile 03/17/2014-negative-total IgE only 3 with no specific elevations on the panel. CBC showed WBC 10,800, eosinophils 4%, PMN 69.3%  ENT dr  told possibly GERD was told Acid reflux and caffiene is causing this cough Office Spirometry 05/01/2016-WNL. FVC 2.57/90%, FEV1 101/94%, ratio 0.78, FEF 25-75% 1.97/120% PFT 03/21/18-WNL or minimal obstruction.  Normal diffusion.  FVC 2.58/90%, FEV1 1.90/88%, ratio 0.74, TLC 96%, DLCO 86% CT chest Hi Res 03/14/2018- 1. Patchy debris throughout the right lower lobe airways, presumably mucoid material. Aspiration is a consideration LAB- Respiratory Allergy Profile 11/27/17- neg for routine environmental allergens, with total IgE < 2 _____________________________________________-   05/25/22- 69 yoF female never smoker followed for  chronic Cough, chronic Bronchitis, complicated by GERD/ Probable Aspiration, aortic Aneurysm, CAD  -Neb albuterol, Atrovent 0.06% nasal spray, singulair, albuterol hfa, Protonix.  Covid vax- 3 Phizer Flu vax-had -----Patient states she has been coughing for several weeks now, with occ phlegm and wheezing. Has been using otc cough syrup Chronic problem with recurrence.  Does not recognize specific triggering event.  Using her rescue inhaler 4 times daily.  We reviewed her other meds.  She dislikes powdered inhalers.  We are going to let her try Breztri. CXR 02/23/22- IMPRESSION: No acute cardiopulmonary disease.  08/24/22- 55 yoF female never smoker followed for  chronic Cough, chronic Bronchitis, complicated by GERD/ Probable Aspiration, aortic Aneurysm, CAD  -Neb albuterol, Atrovent 0.06% nasal spray, singulair, albuterol hfa, Protonix.  -----Fatigue and season allergies causing cough and hoarseness x 2 weeks.  She has noticed outdoors she gets a little hoarse with increased nasal drainage.  Using Atrovent nasal  spray daily.  We discussed alternatives and additional therapies.  Hopefully pollen season is largely past for this year.  Occasional cough drops help with her cough.  Her chronic cough is unchanged, productive occasionally of scant clear phlegm but without purulent bloody material.  Exercise tolerance is stable.                                                                        //? Update HRCT for ILD?// ROS-see HPI   + = positive Constitutional:   No-   weight loss, night sweats, fevers, chills, fatigue, lassitude. HEENT:   No-  headaches, difficulty swallowing, tooth/dental problems, sore throat,       No-  sneezing, itching, ear ache,                               +nasal congestion, post nasal drip,  CV:  No-   chest pain, orthopnea, PND, swelling in lower extremities, anasarca,                                              dizziness, palpitations Resp: No-   shortness of breath with exertion or at rest.               +productive cough,   + cough,  No- coughing up of blood.              +  change in color of mucus.  No- wheezing.   Skin: No-   rash or lesions. GI:  No-   heartburn, indigestion, abdominal pain, nausea, vomiting,  GU: . MS:   Neuro-     nothing unusual Psych:  No- change in mood or affect. No depression or anxiety.  No memory loss.  OBJ- Physical Exam General- Alert, Oriented, Affect-appropriate, Distress- none acute, + overweight Skin- rash-none, lesions- none, excoriation- none Lymphadenopathy- none Head- atraumatic            Eyes- Gross vision intact, PERRLA, conjunctivae and secretions clear            Ears- TMs clear            Nose- Clear, no-Septal dev, mucus, polyps, erosion, perforation             Throat- Mallampati III , mucosa +brown coated ,drainage- none, tonsils- atrophic, hoarse + Neck- flexible , trachea midline, no stridor , thyroid nl, carotid no bruit Chest - symmetrical excursion , unlabored           Heart/CV- RRR , no murmur , no gallop  , no  rub, nl s1 s2                           - JVD- none , edema- none, stasis changes- none, varices- none           Lung-    + crackles esp L base, wheeze-none,  cough+deep , dullness-none, rub- none           Chest wall-  Abd-  Br/ Gen/ Rectal- Not done, not indicated Extrem- cyanosis- none, clubbing, none, atrophy- none, strength- nl Neuro- grossly intact to observation

## 2022-08-24 ENCOUNTER — Ambulatory Visit (INDEPENDENT_AMBULATORY_CARE_PROVIDER_SITE_OTHER): Payer: Medicare Other

## 2022-08-24 ENCOUNTER — Ambulatory Visit: Payer: Medicare Other | Admitting: Internal Medicine

## 2022-08-24 ENCOUNTER — Encounter: Payer: Self-pay | Admitting: Internal Medicine

## 2022-08-24 VITALS — BP 136/70 | HR 76 | Temp 97.8°F | Ht 64.0 in | Wt 172.0 lb

## 2022-08-24 DIAGNOSIS — J9809 Other diseases of bronchus, not elsewhere classified: Secondary | ICD-10-CM | POA: Diagnosis not present

## 2022-08-24 DIAGNOSIS — J42 Unspecified chronic bronchitis: Secondary | ICD-10-CM

## 2022-08-24 DIAGNOSIS — J310 Chronic rhinitis: Secondary | ICD-10-CM | POA: Diagnosis not present

## 2022-08-24 NOTE — Assessment & Plan Note (Signed)
There is an allergic component and probably also a vasomotor component. Plan-try adding Nasalcrom as discussed

## 2022-08-24 NOTE — Assessment & Plan Note (Signed)
Stable chronic cough consistent with known bronchitis/bronchiectasis. Plan-decided not to update chest x-ray at this visit.  Encourage walking and emphasize reflux precautions.

## 2022-08-24 NOTE — Patient Instructions (Signed)
Order    CXR dx Chronic Bronchitis exacerbation  Suggest- try adding otc nasal spray Nasalcrom (cromolyn)

## 2022-09-01 ENCOUNTER — Other Ambulatory Visit: Payer: Self-pay

## 2022-09-01 DIAGNOSIS — I10 Essential (primary) hypertension: Secondary | ICD-10-CM | POA: Diagnosis not present

## 2022-09-01 DIAGNOSIS — J309 Allergic rhinitis, unspecified: Secondary | ICD-10-CM | POA: Diagnosis not present

## 2022-09-01 DIAGNOSIS — Z0000 Encounter for general adult medical examination without abnormal findings: Secondary | ICD-10-CM | POA: Diagnosis not present

## 2022-09-01 DIAGNOSIS — J42 Unspecified chronic bronchitis: Secondary | ICD-10-CM

## 2022-09-01 DIAGNOSIS — I679 Cerebrovascular disease, unspecified: Secondary | ICD-10-CM | POA: Diagnosis not present

## 2022-09-01 DIAGNOSIS — J849 Interstitial pulmonary disease, unspecified: Secondary | ICD-10-CM | POA: Diagnosis not present

## 2022-09-01 DIAGNOSIS — Z9181 History of falling: Secondary | ICD-10-CM | POA: Diagnosis not present

## 2022-09-01 DIAGNOSIS — K219 Gastro-esophageal reflux disease without esophagitis: Secondary | ICD-10-CM | POA: Diagnosis not present

## 2022-09-01 DIAGNOSIS — E7800 Pure hypercholesterolemia, unspecified: Secondary | ICD-10-CM | POA: Diagnosis not present

## 2022-09-01 DIAGNOSIS — D508 Other iron deficiency anemias: Secondary | ICD-10-CM | POA: Diagnosis not present

## 2022-09-01 DIAGNOSIS — E039 Hypothyroidism, unspecified: Secondary | ICD-10-CM | POA: Diagnosis not present

## 2022-09-01 DIAGNOSIS — G4700 Insomnia, unspecified: Secondary | ICD-10-CM | POA: Diagnosis not present

## 2022-09-05 ENCOUNTER — Telehealth: Payer: Self-pay | Admitting: Internal Medicine

## 2022-09-05 NOTE — Telephone Encounter (Signed)
Pt states she wants to send her son to pick up her Sputum samples and also wants to know if they have to be returned back in a 4hr time frame ?

## 2022-09-07 NOTE — Telephone Encounter (Signed)
Spoke with patient regarding prior message.  Patient stated that her son did pick up cups for Sputum samples.  Patient's voice was understanding .Will close this encounter  Nothing else further needed.

## 2022-09-07 NOTE — Telephone Encounter (Signed)
ATC x1 left a voicemail on patient's phone to call our office back regarding prior message.

## 2022-09-08 ENCOUNTER — Other Ambulatory Visit: Payer: Medicare Other

## 2022-09-08 DIAGNOSIS — J42 Unspecified chronic bronchitis: Secondary | ICD-10-CM | POA: Diagnosis not present

## 2022-09-12 LAB — RESPIRATORY CULTURE OR RESPIRATORY AND SPUTUM CULTURE

## 2022-09-15 LAB — RESPIRATORY CULTURE OR RESPIRATORY AND SPUTUM CULTURE: SPECIMEN QUALITY:: ADEQUATE

## 2022-09-25 LAB — RESPIRATORY CULTURE OR RESPIRATORY AND SPUTUM CULTURE: RESULT:: NORMAL

## 2022-09-26 ENCOUNTER — Ambulatory Visit: Payer: Medicare Other | Admitting: Internal Medicine

## 2022-09-28 ENCOUNTER — Other Ambulatory Visit: Payer: Self-pay | Admitting: Internal Medicine

## 2022-10-09 LAB — FUNGUS CULTURE W SMEAR
MICRO NUMBER:: 15026080
SPECIMEN QUALITY:: ADEQUATE

## 2022-10-09 LAB — RESPIRATORY CULTURE OR RESPIRATORY AND SPUTUM CULTURE: MICRO NUMBER:: 15026081

## 2022-10-26 LAB — AFB CULTURE WITH SMEAR (NOT AT ARMC)
Acid Fast Culture: NEGATIVE
Acid Fast Smear: NEGATIVE

## 2023-01-23 DIAGNOSIS — J849 Interstitial pulmonary disease, unspecified: Secondary | ICD-10-CM | POA: Diagnosis not present

## 2023-01-23 DIAGNOSIS — J0100 Acute maxillary sinusitis, unspecified: Secondary | ICD-10-CM | POA: Diagnosis not present

## 2023-02-25 NOTE — Progress Notes (Unsigned)
HPI  female never smoker followed for chronic cough, chronic bronchitis, complicated by GERD, ectatic aorta Allergy profile 03/17/2014-negative-total IgE only 3 with no specific elevations on the panel. CBC showed WBC 10,800, eosinophils 4%, PMN 69.3%  ENT dr  told possibly GERD was told Acid reflux and caffiene is causing this cough Office Spirometry 05/01/2016-WNL. FVC 2.57/90%, FEV1 101/94%, ratio 0.78, FEF 25-75% 1.97/120% PFT 03/21/18-WNL or minimal obstruction.  Normal diffusion.  FVC 2.58/90%, FEV1 1.90/88%, ratio 0.74, TLC 96%, DLCO 86% CT chest Hi Res 03/14/2018- 1. Patchy debris throughout the right lower lobe airways, presumably mucoid material. Aspiration is a consideration LAB- Respiratory Allergy Profile 11/27/17- neg for routine environmental allergens, with total IgE < 2 _____________________________________________-    08/24/22- 66 yoF female never smoker followed for  chronic Cough, chronic Bronchitis, complicated by GERD/ Probable Aspiration, aortic Aneurysm, CAD  -Neb albuterol, Atrovent 0.06% nasal spray, singulair, albuterol hfa, Protonix.  -----Fatigue and season allergies causing cough and hoarseness x 2 weeks.  She has noticed outdoors she gets a little hoarse with increased nasal drainage.  Using Atrovent nasal spray daily.  We discussed alternatives and additional therapies.  Hopefully pollen season is largely past for this year.  Occasional cough drops help with her cough.  Her chronic cough is unchanged, productive occasionally of scant clear phlegm but without purulent bloody material.  Exercise tolerance is stable.                                                                        //? Update HRCT for ILD?//  02/26/23- 14 yoF female never smoker followed for  chronic Cough, chronic Bronchitis, complicated by GERD/ Probable Aspiration, aortic Aneurysm, CAD  -Neb albuterol, Atrovent 0.06% nasal spray, singulair, albuterol hfa, Protonix.  Lab- Sputum cx 09/08/22- only  normal flora Had flu vax.   CXR 08/28/22-  IMPRESSION: Bronchial wall thickening with mild reticulonodular opacities may reflect bronchitis or other atypical infection. Negative for lobar Pneumonia  ROS-see HPI   + = positive Constitutional:   No-   weight loss, night sweats, fevers, chills, fatigue, lassitude. HEENT:   No-  headaches, difficulty swallowing, tooth/dental problems, sore throat,       No-  sneezing, itching, ear ache,                               +nasal congestion, post nasal drip,  CV:  No-   chest pain, orthopnea, PND, swelling in lower extremities, anasarca,                                              dizziness, palpitations Resp: No-   shortness of breath with exertion or at rest.               +productive cough,   + cough,  No- coughing up of blood.              +change in color of mucus.  No- wheezing.   Skin: No-   rash or lesions. GI:  No-   heartburn, indigestion, abdominal  pain, nausea, vomiting,  GU: . MS:   Neuro-     nothing unusual Psych:  No- change in mood or affect. No depression or anxiety.  No memory loss.  OBJ- Physical Exam General- Alert, Oriented, Affect-appropriate, Distress- none acute, + overweight Skin- rash-none, lesions- none, excoriation- none Lymphadenopathy- none Head- atraumatic            Eyes- Gross vision intact, PERRLA, conjunctivae and secretions clear            Ears- TMs clear            Nose- Clear, no-Septal dev, mucus, polyps, erosion, perforation             Throat- Mallampati III , mucosa +brown coated ,drainage- none, tonsils- atrophic, hoarse + Neck- flexible , trachea midline, no stridor , thyroid nl, carotid no bruit Chest - symmetrical excursion , unlabored           Heart/CV- RRR , no murmur , no gallop  , no rub, nl s1 s2                           - JVD- none , edema- none, stasis changes- none, varices- none           Lung-    + crackles esp L base, wheeze-none,  cough+deep , dullness-none, rub- none            Chest wall-  Abd-  Br/ Gen/ Rectal- Not done, not indicated Extrem- cyanosis- none, clubbing, none, atrophy- none, strength- nl Neuro- grossly intact to observation

## 2023-02-26 ENCOUNTER — Encounter: Payer: Self-pay | Admitting: Internal Medicine

## 2023-02-26 ENCOUNTER — Ambulatory Visit: Payer: Medicare Other | Admitting: Internal Medicine

## 2023-02-26 VITALS — BP 137/87 | HR 95 | Ht 64.5 in | Wt 169.2 lb

## 2023-02-26 DIAGNOSIS — J418 Mixed simple and mucopurulent chronic bronchitis: Secondary | ICD-10-CM | POA: Diagnosis not present

## 2023-02-26 DIAGNOSIS — K219 Gastro-esophageal reflux disease without esophagitis: Secondary | ICD-10-CM | POA: Diagnosis not present

## 2023-02-26 MED ORDER — SPIRIVA RESPIMAT 2.5 MCG/ACT IN AERS: 2.0000 | INHALATION_SPRAY | Freq: Every day | RESPIRATORY_TRACT | Status: DC

## 2023-02-26 MED ORDER — BENZONATATE 200 MG PO CAPS: 200.0000 mg | ORAL_CAPSULE | Freq: Three times a day (TID) | ORAL | 1 refills | Status: DC | PRN

## 2023-02-26 MED ORDER — DOXYCYCLINE HYCLATE 100 MG PO TABS
100.0000 mg | ORAL_TABLET | Freq: Two times a day (BID) | ORAL | 0 refills | Status: DC
Start: 2023-02-26 — End: 2023-03-20

## 2023-02-26 NOTE — Patient Instructions (Signed)
Order- sample x 2 Spiriva 2.5   inhale 2 puffs daily   See if this helps your breathing  Script sent for tessalon perles for cough as needed  Script sent for doxycycline antibiotic

## 2023-03-01 DIAGNOSIS — I10 Essential (primary) hypertension: Secondary | ICD-10-CM | POA: Diagnosis not present

## 2023-03-01 DIAGNOSIS — D508 Other iron deficiency anemias: Secondary | ICD-10-CM | POA: Diagnosis not present

## 2023-03-01 DIAGNOSIS — E7800 Pure hypercholesterolemia, unspecified: Secondary | ICD-10-CM | POA: Diagnosis not present

## 2023-03-01 DIAGNOSIS — K219 Gastro-esophageal reflux disease without esophagitis: Secondary | ICD-10-CM | POA: Diagnosis not present

## 2023-03-01 DIAGNOSIS — J309 Allergic rhinitis, unspecified: Secondary | ICD-10-CM | POA: Diagnosis not present

## 2023-03-01 DIAGNOSIS — E039 Hypothyroidism, unspecified: Secondary | ICD-10-CM | POA: Diagnosis not present

## 2023-03-01 DIAGNOSIS — J849 Interstitial pulmonary disease, unspecified: Secondary | ICD-10-CM | POA: Diagnosis not present

## 2023-03-01 DIAGNOSIS — I679 Cerebrovascular disease, unspecified: Secondary | ICD-10-CM | POA: Diagnosis not present

## 2023-03-01 DIAGNOSIS — J4520 Mild intermittent asthma, uncomplicated: Secondary | ICD-10-CM | POA: Diagnosis not present

## 2023-03-01 DIAGNOSIS — G4700 Insomnia, unspecified: Secondary | ICD-10-CM | POA: Diagnosis not present

## 2023-03-20 ENCOUNTER — Encounter: Payer: Self-pay | Admitting: Internal Medicine

## 2023-03-20 ENCOUNTER — Ambulatory Visit: Payer: Medicare Other | Admitting: Internal Medicine

## 2023-03-20 ENCOUNTER — Ambulatory Visit: Payer: Medicare Other

## 2023-03-20 VITALS — BP 112/64 | HR 87 | Temp 98.0°F | Ht 64.0 in | Wt 166.4 lb

## 2023-03-20 DIAGNOSIS — J690 Pneumonitis due to inhalation of food and vomit: Secondary | ICD-10-CM

## 2023-03-20 DIAGNOSIS — K219 Gastro-esophageal reflux disease without esophagitis: Secondary | ICD-10-CM | POA: Diagnosis not present

## 2023-03-20 DIAGNOSIS — J418 Mixed simple and mucopurulent chronic bronchitis: Secondary | ICD-10-CM | POA: Diagnosis not present

## 2023-03-20 MED ORDER — CEFDINIR 300 MG PO CAPS: 300.0000 mg | ORAL_CAPSULE | Freq: Two times a day (BID) | ORAL | 0 refills | Status: AC

## 2023-03-20 NOTE — Progress Notes (Signed)
HPI  female never smoker followed for chronic cough, chronic bronchitis, complicated by GERD, ectatic aorta Allergy profile 03/17/2014-negative-total IgE only 3 with no specific elevations on the panel. CBC showed WBC 10,800, eosinophils 4%, PMN 69.3%  ENT dr  told possibly GERD was told Acid reflux and caffiene is causing this cough Office Spirometry 05/01/2016-WNL. FVC 2.57/90%, FEV1 101/94%, ratio 0.78, FEF 25-75% 1.97/120% PFT 03/21/18-WNL or minimal obstruction.  Normal diffusion.  FVC 2.58/90%, FEV1 1.90/88%, ratio 0.74, TLC 96%, DLCO 86% CT chest Hi Res 03/14/2018- 1. Patchy debris throughout the right lower lobe airways, presumably mucoid material. Aspiration is a consideration LAB- Respiratory Allergy Profile 11/27/17- neg for routine environmental allergens, with total IgE < 2 _____________________________________________-    08/24/22- 69 yoF female never smoker followed for  chronic Cough, chronic Bronchitis, complicated by GERD/ Probable Aspiration, aortic Aneurysm, CAD  -Neb albuterol, Atrovent 0.06% nasal spray, singulair, albuterol hfa, Protonix.  -----Fatigue and season allergies causing cough and hoarseness x 2 weeks.  She has noticed outdoors she gets a little hoarse with increased nasal drainage.  Using Atrovent nasal spray daily.  We discussed alternatives and additional therapies.  Hopefully pollen season is largely past for this year.  Occasional cough drops help with her cough.  Her chronic cough is unchanged, productive occasionally of scant clear phlegm but without purulent bloody material.  Exercise tolerance is stable.                                                                        //? Update HRCT for ILD?//  02/26/23- 75 yoF female never smoker followed for  chronic Cough, chronic Bronchitis, complicated by GERD/ Probable Aspiration, aortic Aneurysm, CAD  -Neb albuterol, Atrovent 0.06% nasal spray, singulair, albuterol hfa, Protonix.  Lab- Sputum cx 09/08/22- only  normal flora Had flu vax. Acute visit increased cough with tussive pain. Sick x 1 week with increased productive cough, clear mucus. Spiriva sample helps some. No definite fever. CXR 08/28/22-  IMPRESSION: Bronchial wall thickening with mild reticulonodular opacities may reflect bronchitis or other atypical infection. Negative for lobar Pneumonia  ROS-see HPI   + = positive Constitutional:   No-   weight loss, night sweats, fevers, chills, fatigue, lassitude. HEENT:   No-  headaches, difficulty swallowing, tooth/dental problems, sore throat,       No-  sneezing, itching, ear ache,                               +nasal congestion, post nasal drip,  CV:  No-   chest pain, orthopnea, PND, swelling in lower extremities, anasarca,                                              dizziness, palpitations Resp: No-   shortness of breath with exertion or at rest.               +productive cough,   + cough,  No- coughing up of blood.              +change in color  of mucus.  No- wheezing.   Skin: No-   rash or lesions. GI:  No-   heartburn, indigestion, abdominal pain, nausea, vomiting,  GU: . MS:   Neuro-     nothing unusual Psych:  No- change in mood or affect. No depression or anxiety.  No memory loss.  OBJ- Physical Exam General- Alert, Oriented, Affect-appropriate, Distress- none acute, + overweight Skin- cool/ dry Lymphadenopathy- none Head- atraumatic            Eyes- Gross vision intact, PERRLA, conjunctivae and secretions clear            Ears- TMs clear            Nose- Clear, no-Septal dev, mucus, polyps, erosion, perforation             Throat- Mallampati III , mucosa +brown coated ,drainage- none, tonsils- atrophic, hoarse + Neck- flexible , trachea midline, no stridor , thyroid nl, carotid no bruit Chest - symmetrical excursion , unlabored           Heart/CV- RRR , no murmur , no gallop  , no rub, nl s1 s2                           - JVD- none , edema- none, stasis changes- none,  varices- none           Lung-    + crackles esp L base, wheeze-none,  cough+raspy , dullness-none, rub- none           Chest wall-  Abd-  Br/ Gen/ Rectal- Not done, not indicated Extrem- cyanosis- none, clubbing, none, atrophy- none, strength- nl, +nail beds pink with good filling Neuro- grossly intact to observation

## 2023-03-20 NOTE — Patient Instructions (Addendum)
Order- CXR    dx aspiration pneumonia  Order - lab- CBC with diff      dx aspiration pneumonia  Keep up with the acid  blocker Protonix and continue the measures like sleeping with you head up to reduce reflux.  Script sent for cefdinir antibiotic- 1 twice daily

## 2023-03-21 LAB — CBC WITH DIFFERENTIAL/PLATELET
Basophils Absolute: 0.1 10*3/uL (ref 0.0–0.1)
Basophils Relative: 0.7 % (ref 0.0–3.0)
Eosinophils Absolute: 0.4 10*3/uL (ref 0.0–0.7)
Eosinophils Relative: 4.1 % (ref 0.0–5.0)
HCT: 42.3 % (ref 36.0–46.0)
Hemoglobin: 13.8 g/dL (ref 12.0–15.0)
Lymphocytes Relative: 24.2 % (ref 12.0–46.0)
Lymphs Abs: 2.1 10*3/uL (ref 0.7–4.0)
MCHC: 32.6 g/dL (ref 30.0–36.0)
MCV: 96.8 fL (ref 78.0–100.0)
Monocytes Absolute: 0.8 10*3/uL (ref 0.1–1.0)
Monocytes Relative: 9.2 % (ref 3.0–12.0)
Neutro Abs: 5.4 10*3/uL (ref 1.4–7.7)
Neutrophils Relative %: 61.8 % (ref 43.0–77.0)
Platelets: 351 10*3/uL (ref 150.0–400.0)
RBC: 4.37 Mil/uL (ref 3.87–5.11)
RDW: 13.6 % (ref 11.5–15.5)
WBC: 8.8 10*3/uL (ref 4.0–10.5)

## 2023-04-12 ENCOUNTER — Encounter: Payer: Self-pay | Admitting: Internal Medicine

## 2023-04-12 NOTE — Assessment & Plan Note (Signed)
 Continue to emphasize reflux precautions for suspected microaspiration

## 2023-04-12 NOTE — Assessment & Plan Note (Signed)
 Chronic bronchitis Plan- doxycycline, Benzonatate perles, sample Spiriva 2.5

## 2023-04-13 ENCOUNTER — Other Ambulatory Visit: Payer: Self-pay | Admitting: Internal Medicine

## 2023-05-02 NOTE — Assessment & Plan Note (Signed)
We have suspected occasional aspiration but never proven, as cause of bronchitis flares Plan- continue reminder about reflux precautions

## 2023-05-02 NOTE — Assessment & Plan Note (Signed)
Acute exacerbation of chronic bronchitis Plan- CBC w diff, CXR, cefdinir

## 2023-05-03 ENCOUNTER — Telehealth: Payer: Self-pay | Admitting: Internal Medicine

## 2023-05-03 MED ORDER — SPIRIVA RESPIMAT 2.5 MCG/ACT IN AERS: INHALATION_SPRAY | RESPIRATORY_TRACT | 12 refills | Status: AC

## 2023-05-03 NOTE — Telephone Encounter (Signed)
Spiriva script sent.

## 2023-05-03 NOTE — Telephone Encounter (Signed)
Called and spoke with patient, she states that the sample of the Spiriva sample that we sent home with her helped and she is requesting a prescription be sent to her pharmacy, [facility] in [place].  Advised I would send a message to Dr. Maple Hudson and once we hear back we will call her back.  She verbalized understanding.  Dr. Maple Hudson,  Please advise if ok to send in prescription for Spiriva 2.5 to patient's pharmacy.  Thank you.

## 2023-05-03 NOTE — Telephone Encounter (Signed)
Called and spoke with patient, advised that the prescription for Spiriva had been sent to her pharmacy.  She verbalized understanding.  Nothing further needed.

## 2023-05-03 NOTE — Telephone Encounter (Signed)
Patient would like new RX for Spiriva Respimat. Patient out of medication. Pharmacy is [facility] [place]. Patient phone number is [phone].

## 2023-05-18 ENCOUNTER — Telehealth: Payer: Self-pay | Admitting: Internal Medicine

## 2023-05-18 MED ORDER — BENZONATATE 200 MG PO CAPS: 200.0000 mg | ORAL_CAPSULE | Freq: Three times a day (TID) | ORAL | 5 refills | Status: DC | PRN

## 2023-05-18 NOTE — Telephone Encounter (Signed)
 Benzonatate  refilled [facility]

## 2023-06-02 ENCOUNTER — Other Ambulatory Visit: Payer: Self-pay | Admitting: Internal Medicine

## 2023-07-12 ENCOUNTER — Other Ambulatory Visit: Payer: Self-pay | Admitting: Internal Medicine

## 2023-07-12 DIAGNOSIS — J410 Simple chronic bronchitis: Secondary | ICD-10-CM

## 2023-08-25 NOTE — Progress Notes (Signed)
 HPI  female never smoker followed for chronic cough, chronic bronchitis, complicated by GERD, ectatic aorta Allergy  profile 03/17/2014-negative-total IgE only 3 with no specific elevations on the panel. CBC showed WBC 10,800, eosinophils 4%, PMN 69.3%  ENT dr  told possibly GERD was told Acid reflux and caffiene is causing this cough Office Spirometry 05/01/2016-WNL. FVC 2.57/90%, FEV1 101/94%, ratio 0.78, FEF 25-75% 1.97/120% PFT 03/21/18-WNL or minimal obstruction.  Normal diffusion.  FVC 2.58/90%, FEV1 1.90/88%, ratio 0.74, TLC 96%, DLCO 86% CT chest Hi Res 03/14/2018- 1. Patchy debris throughout the right lower lobe airways, presumably mucoid material. Aspiration is a consideration LAB- Respiratory Allergy  Profile 11/27/17- neg for routine environmental allergens, with total IgE < 2 _____________________________________________-    02/26/23- 16 yoF female never smoker followed for  chronic Cough, chronic Bronchitis, complicated by GERD/ Probable Aspiration, aortic Aneurysm, CAD  -Neb albuterol , Atrovent  0.06% nasal spray, singulair , albuterol  hfa, Protonix.  Lab- Sputum cx 09/08/22- only normal flora Had flu vax. Acute visit increased cough with tussive pain. Sick x 1 week with increased productive cough, clear mucus. Spiriva  sample helps some. No definite fever. CXR 08/28/22-  IMPRESSION: Bronchial wall thickening with mild reticulonodular opacities may reflect bronchitis or other atypical infection. Negative for lobar Pneumonia  08/27/23- 64 yoF female never smoker followed for  chronic Cough, chronic Bronchitis, complicated by GERD/ Probable Aspiration, aortic Aneurysm, CAD  -Neb albuterol , Atrovent  0.06% nasal spray, singulair , albuterol  hfa, Protonix.  CXR 03/26/23- MPRESSION: Improving aeration of the lungs, without evidence of acute cardiopulmonary disease Discussed the use of AI scribe software for clinical note transcription with the patient, who gave verbal consent to  proceed.  History of Present Illness   Yolanda Hill is an 83 year old female who presents with a persistent cough for over a week.  The cough is intermittent and began with increased wind exposure, despite staying indoors. There is no fever or sore throat. Congestion in the upper lung has resolved. She produces clear phlegm. Environmental factors like pollen and mosquitoes influence her decision to stay indoors. She has reflux and aware of partial diaphragm elevation. Many if not all of her exacerbations likely reflect aspiration as addressed in past.     Assessment and Plan:    Cough with possible reflux Chronic cough likely exacerbated by reflux, especially during sleep. Reflux suspected as a contributing factor due to possible aspiration. Antibiotic use not immediately indicated. - Order chest x-ray to evaluate current chest condition. She thinks she is getting better, with no purulence or fever. - Prescribe azithromycin  (Z-Pak) to be kept on hand and used if symptoms worsen in the next 2-3 days.  Congestion in upper chest Intermittent congestion possibly related to reflux and environmental factors. Symptoms improved with current management. No immediate antibiotic use indicated unless symptoms worsen. - Advise wearing a dust and pollen mask when outdoors to reduce exposure to environmental triggers.     ROS-see HPI   + = positive Constitutional:   No-   weight loss, night sweats, fevers, chills, fatigue, lassitude. HEENT:   No-  headaches, difficulty swallowing, tooth/dental problems, sore throat,       No-  sneezing, itching, ear ache,                               +nasal congestion, post nasal drip,  CV:  No-   chest pain, orthopnea, PND, swelling in lower extremities, anasarca,  dizziness, palpitations Resp: No-   shortness of breath with exertion or at rest.               +productive cough,   + cough,  No- coughing up of blood.               +change in color of mucus.  No- wheezing.   Skin: No-   rash or lesions. GI:  No-   heartburn, indigestion, abdominal pain, nausea, vomiting,  GU: . MS:   Neuro-     nothing unusual Psych:  No- change in mood or affect. No depression or anxiety.  No memory loss.  OBJ- Physical Exam General- Alert, Oriented, Affect-appropriate, Distress- none acute, + overweight Skin- cool/ dry Lymphadenopathy- none Head- atraumatic            Eyes- Gross vision intact, PERRLA, conjunctivae and secretions clear            Ears- TMs clear            Nose- Clear, no-Septal dev, mucus, polyps, erosion, perforation             Throat- Mallampati III , mucosa +brown coated ,drainage- none, tonsils- atrophic, hoarse + Neck- flexible , trachea midline, no stridor , thyroid  nl, carotid no bruit Chest - symmetrical excursion , unlabored           Heart/CV- RRR , no murmur , no gallop  , no rub, nl s1 s2                           - JVD- none , edema- none, stasis changes- none, varices- none           Lung-    + crackles esp L base, wheeze-none,  cough+raspy , dullness-none, rub- none           Chest wall-  Abd-  Br/ Gen/ Rectal- Not done, not indicated Extrem- cyanosis- none, clubbing, none, atrophy- none, strength- nl, +nail beds pink with good filling Neuro- grossly intact to observation

## 2023-08-27 ENCOUNTER — Ambulatory Visit

## 2023-08-27 ENCOUNTER — Ambulatory Visit: Payer: Medicare Other | Admitting: Internal Medicine

## 2023-08-27 ENCOUNTER — Encounter: Payer: Self-pay | Admitting: Internal Medicine

## 2023-08-27 VITALS — BP 137/75 | HR 83 | Ht 64.0 in | Wt 166.2 lb

## 2023-08-27 DIAGNOSIS — R059 Cough, unspecified: Secondary | ICD-10-CM | POA: Diagnosis not present

## 2023-08-27 DIAGNOSIS — J209 Acute bronchitis, unspecified: Secondary | ICD-10-CM

## 2023-08-27 DIAGNOSIS — R0989 Other specified symptoms and signs involving the circulatory and respiratory systems: Secondary | ICD-10-CM

## 2023-08-27 MED ORDER — AZITHROMYCIN 250 MG PO TABS: ORAL_TABLET | ORAL | 0 refills | Status: AC

## 2023-08-27 NOTE — Patient Instructions (Signed)
 Order- CXR   dx Acute bronchitis  Script sent for Zpak antibiotic to hold for when you need it.

## 2023-08-28 ENCOUNTER — Ambulatory Visit: Payer: Self-pay | Admitting: Internal Medicine

## 2023-08-28 NOTE — Telephone Encounter (Signed)
-----   Message from Clare sent at 08/28/2023  9:59 AM EDT ----- CXR- looks stable. No pneumonia seen.

## 2023-08-28 NOTE — Telephone Encounter (Signed)
 CXR result. Pt didn't answer, left a message to call back

## 2023-09-11 DIAGNOSIS — J309 Allergic rhinitis, unspecified: Secondary | ICD-10-CM | POA: Diagnosis not present

## 2023-09-11 DIAGNOSIS — E7800 Pure hypercholesterolemia, unspecified: Secondary | ICD-10-CM | POA: Diagnosis not present

## 2023-09-11 DIAGNOSIS — G4700 Insomnia, unspecified: Secondary | ICD-10-CM | POA: Diagnosis not present

## 2023-09-11 DIAGNOSIS — J4520 Mild intermittent asthma, uncomplicated: Secondary | ICD-10-CM | POA: Diagnosis not present

## 2023-09-11 DIAGNOSIS — Z0000 Encounter for general adult medical examination without abnormal findings: Secondary | ICD-10-CM | POA: Diagnosis not present

## 2023-09-11 DIAGNOSIS — J410 Simple chronic bronchitis: Secondary | ICD-10-CM | POA: Diagnosis not present

## 2023-09-11 DIAGNOSIS — I679 Cerebrovascular disease, unspecified: Secondary | ICD-10-CM | POA: Diagnosis not present

## 2023-09-11 DIAGNOSIS — E039 Hypothyroidism, unspecified: Secondary | ICD-10-CM | POA: Diagnosis not present

## 2023-09-11 DIAGNOSIS — K219 Gastro-esophageal reflux disease without esophagitis: Secondary | ICD-10-CM | POA: Diagnosis not present

## 2023-09-11 DIAGNOSIS — I10 Essential (primary) hypertension: Secondary | ICD-10-CM | POA: Diagnosis not present

## 2023-11-12 ENCOUNTER — Other Ambulatory Visit: Payer: Self-pay | Admitting: Internal Medicine

## 2023-11-12 NOTE — Telephone Encounter (Unsigned)
 Copied from CRM #[id number]. Topic: Clinical - Medication Refill >> Nov 12, 2023  3:27 PM Whitney O wrote: Medication: benzonatate  (TESSALON ) 200 MG capsule  Has the patient contacted their pharmacy? Yes no more refills  (Agent: If no, request that the patient contact the pharmacy for the refill. If patient does not wish to contact the pharmacy document the reason why and proceed with request.) (Agent: If yes, when and what did the pharmacy advise?)  This is the patient's preferred pharmacy:  [id number] - [place], [place] -  [address]  [phone]    Is this the correct pharmacy for this prescription? Yes If no, delete pharmacy and type the correct one.  [id number] - [place], [place] -  [address] [phone] [  Has the prescription been filled recently? No  Is the patient out of the medication? Yes  Has the patient been seen for an appointment in the last year OR does the patient have an upcoming appointment? Yes 08/27/2023  Can we respond through MyChart? Yes  Agent: Please be advised that Rx refills may take up to 3 business days. We ask that you follow-up with your pharmacy.

## 2023-11-13 MED ORDER — BENZONATATE 200 MG PO CAPS: 200.0000 mg | ORAL_CAPSULE | Freq: Three times a day (TID) | ORAL | 5 refills | Status: AC | PRN

## 2023-11-13 NOTE — Telephone Encounter (Signed)
 Patient requesting refill for Tessalon  . Dr.Young can you please advise

## 2023-11-13 NOTE — Telephone Encounter (Signed)
Tessalon refilled °
